# Patient Record
Sex: Female | Born: 1975 | Race: White | Hispanic: No | Marital: Single | Smoking: Former smoker
Health system: Southern US, Community
[De-identification: ages and names within clinical notes are randomized; demographics above are authoritative.]

## PROBLEM LIST (undated history)

## (undated) DIAGNOSIS — J45909 Unspecified asthma, uncomplicated: Secondary | ICD-10-CM

## (undated) DIAGNOSIS — D649 Anemia, unspecified: Secondary | ICD-10-CM

## (undated) DIAGNOSIS — I10 Essential (primary) hypertension: Secondary | ICD-10-CM

## (undated) HISTORY — PX: TUBAL LIGATION: SHX77

## (undated) HISTORY — PX: KNEE SURGERY: SHX244

## (undated) HISTORY — DX: Anemia, unspecified: D64.9

## (undated) HISTORY — DX: Essential (primary) hypertension: I10

## (undated) HISTORY — DX: Unspecified asthma, uncomplicated: J45.909

---

## 1999-12-04 ENCOUNTER — Emergency Department (HOSPITAL_COMMUNITY): Admission: EM | Admit: 1999-12-04 | Discharge: 1999-12-04 | Payer: Self-pay | Admitting: Emergency Medicine

## 2000-06-26 ENCOUNTER — Emergency Department (HOSPITAL_COMMUNITY): Admission: EM | Admit: 2000-06-26 | Discharge: 2000-06-26 | Payer: Self-pay | Admitting: Emergency Medicine

## 2002-02-01 ENCOUNTER — Encounter: Payer: Self-pay | Admitting: Emergency Medicine

## 2002-02-01 ENCOUNTER — Emergency Department (HOSPITAL_COMMUNITY): Admission: EM | Admit: 2002-02-01 | Discharge: 2002-02-01 | Payer: Self-pay | Admitting: Emergency Medicine

## 2006-07-25 ENCOUNTER — Emergency Department (HOSPITAL_COMMUNITY): Admission: EM | Admit: 2006-07-25 | Discharge: 2006-07-25 | Payer: Self-pay | Admitting: Emergency Medicine

## 2006-11-16 ENCOUNTER — Emergency Department (HOSPITAL_COMMUNITY): Admission: EM | Admit: 2006-11-16 | Discharge: 2006-11-17 | Payer: Self-pay | Admitting: Emergency Medicine

## 2008-08-26 ENCOUNTER — Emergency Department (HOSPITAL_COMMUNITY): Admission: EM | Admit: 2008-08-26 | Discharge: 2008-08-26 | Payer: Self-pay | Admitting: Emergency Medicine

## 2008-10-13 ENCOUNTER — Emergency Department (HOSPITAL_COMMUNITY): Admission: EM | Admit: 2008-10-13 | Discharge: 2008-10-13 | Payer: Self-pay | Admitting: Emergency Medicine

## 2009-02-05 ENCOUNTER — Emergency Department (HOSPITAL_COMMUNITY): Admission: EM | Admit: 2009-02-05 | Discharge: 2009-02-06 | Payer: Self-pay | Admitting: Emergency Medicine

## 2010-07-17 LAB — CBC
HCT: 39 % (ref 36.0–46.0)
Hemoglobin: 13.5 g/dL (ref 12.0–15.0)
MCHC: 34.5 g/dL (ref 30.0–36.0)
MCV: 89.5 fL (ref 78.0–100.0)
Platelets: 284 10*3/uL (ref 150–400)
RBC: 4.36 MIL/uL (ref 3.87–5.11)
RDW: 12.9 % (ref 11.5–15.5)
WBC: 9.9 10*3/uL (ref 4.0–10.5)

## 2010-07-17 LAB — WET PREP, GENITAL
Clue Cells Wet Prep HPF POC: NONE SEEN
Trich, Wet Prep: NONE SEEN

## 2010-07-17 LAB — DIFFERENTIAL
Basophils Absolute: 0 10*3/uL (ref 0.0–0.1)
Basophils Relative: 1 % (ref 0–1)
Eosinophils Absolute: 0.1 10*3/uL (ref 0.0–0.7)
Eosinophils Relative: 1 % (ref 0–5)
Lymphocytes Relative: 21 % (ref 12–46)
Lymphs Abs: 2.1 10*3/uL (ref 0.7–4.0)
Monocytes Absolute: 0.7 10*3/uL (ref 0.1–1.0)
Monocytes Relative: 7 % (ref 3–12)
Neutro Abs: 6.9 10*3/uL (ref 1.7–7.7)
Neutrophils Relative %: 70 % (ref 43–77)

## 2010-07-17 LAB — PROTIME-INR: Prothrombin Time: 13.1 seconds (ref 11.6–15.2)

## 2010-07-20 LAB — STREP A DNA PROBE

## 2010-07-20 LAB — RAPID STREP SCREEN (MED CTR MEBANE ONLY): Streptococcus, Group A Screen (Direct): NEGATIVE

## 2010-10-23 IMAGING — US US PELVIS COMPLETE MODIFY
1 series · 14 of 25 positions shown · non-contrast
Comparison: None available.

CLINICAL DATA: Vaginal bleeding. Pelvic pain.

TRANSABDOMINAL ULTRASOUND OF PELVIS
TECHNIQUE: Transabdominal ultrasound examination of the pelvis was
performed including evaluation of the uterus, ovaries, adnexal
regions, and pelvic cul-de-sac.

[Series 1: us pelvis complete modify · 0.28mm/px · 14 of 92 slices shown]
[im 1/92]
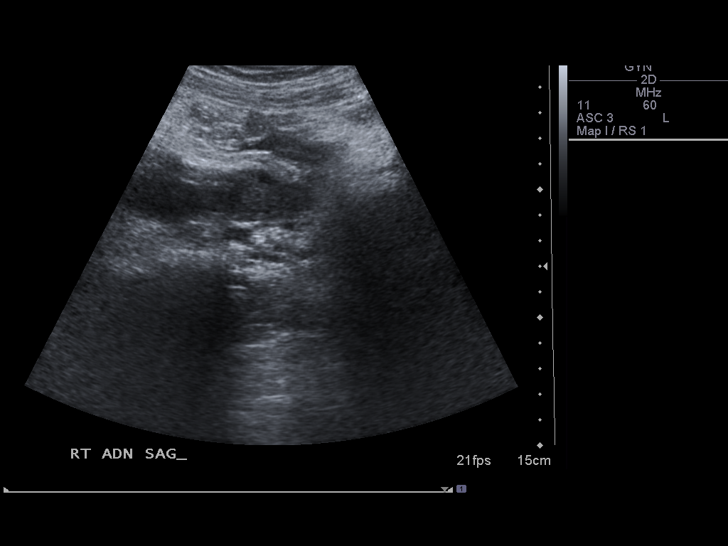
[im 8/92]
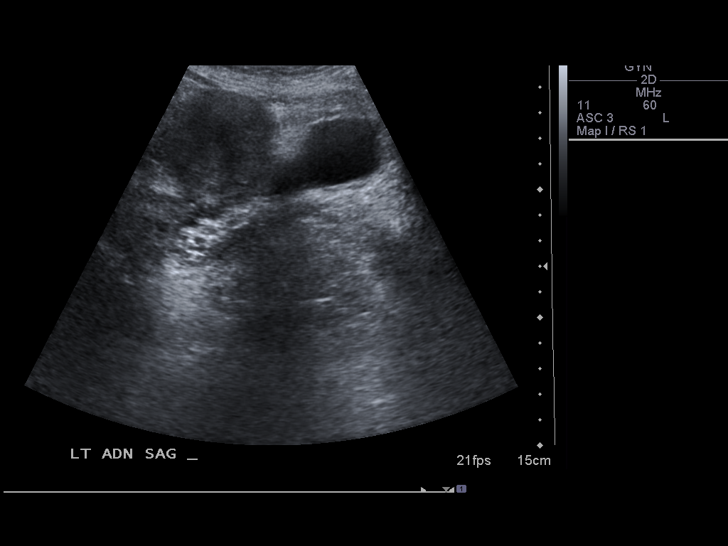
[im 16/92]
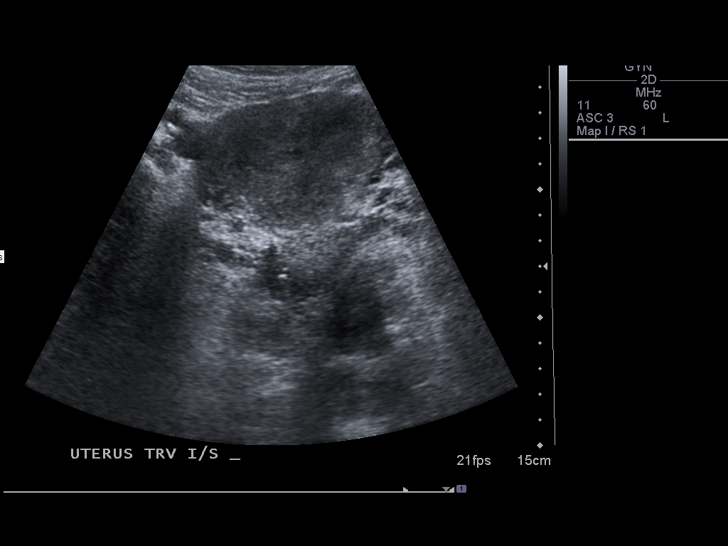
[im 23/92]
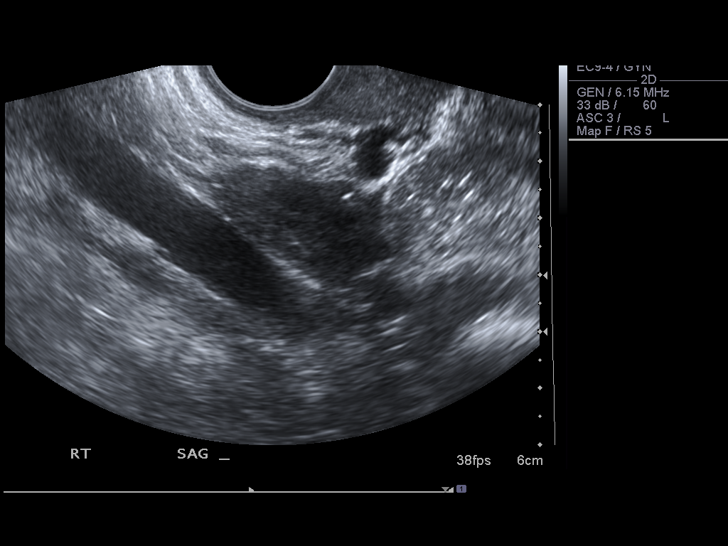
[im 31/92]
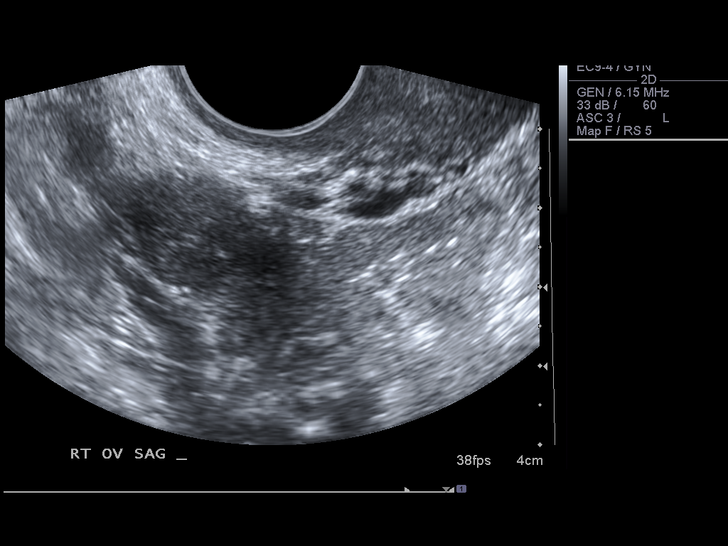
[im 35/92]
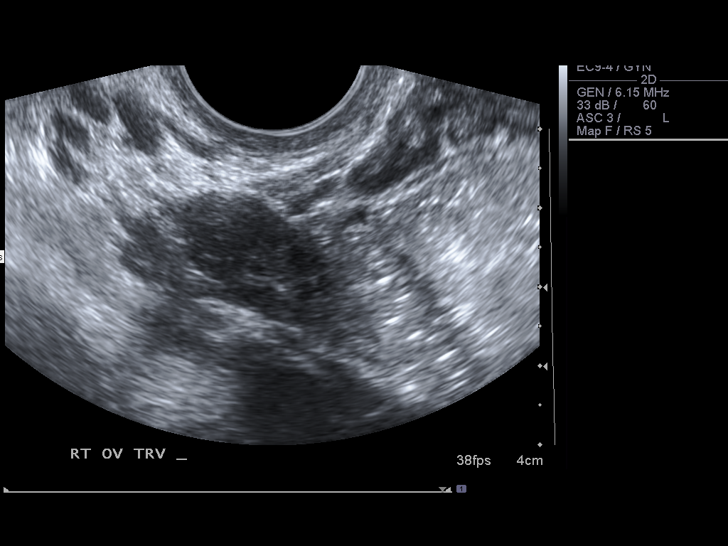
[im 42/92]
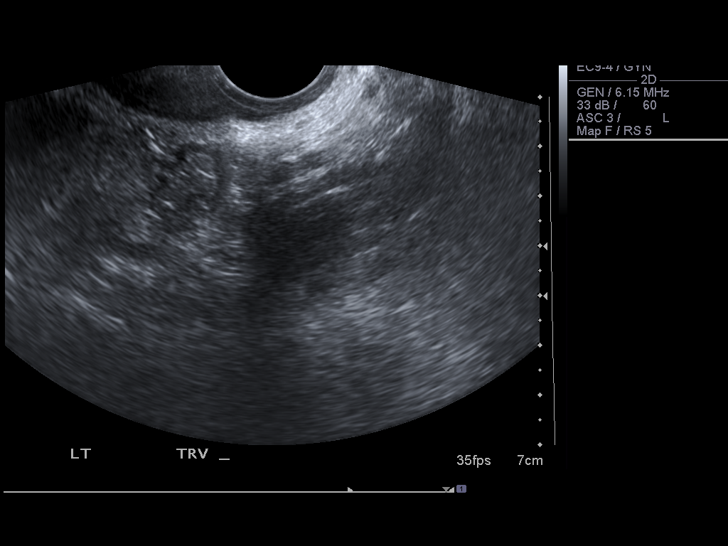
[im 50/92]
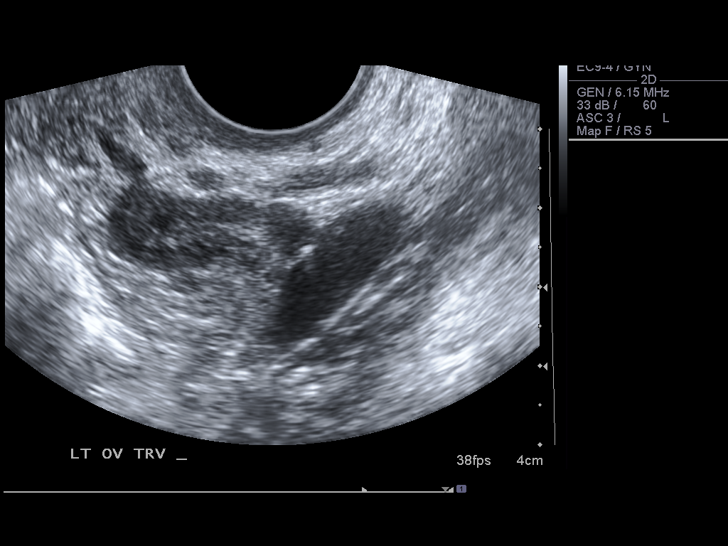
[im 57/92]
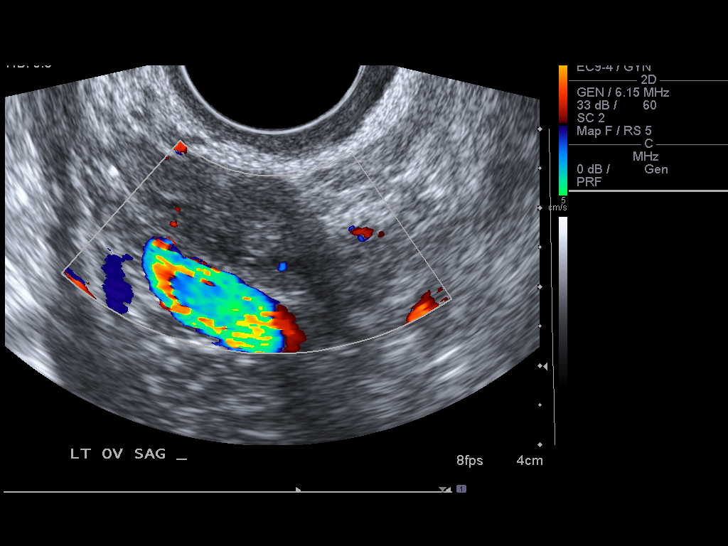
[im 61/92]
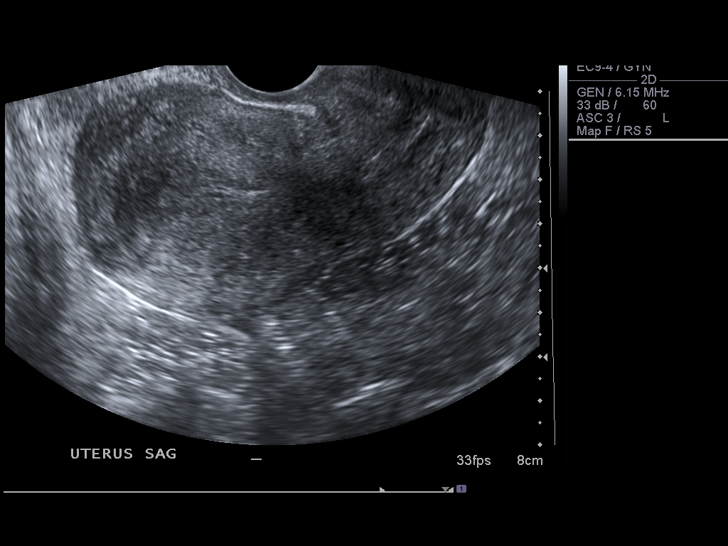
[im 69/92]
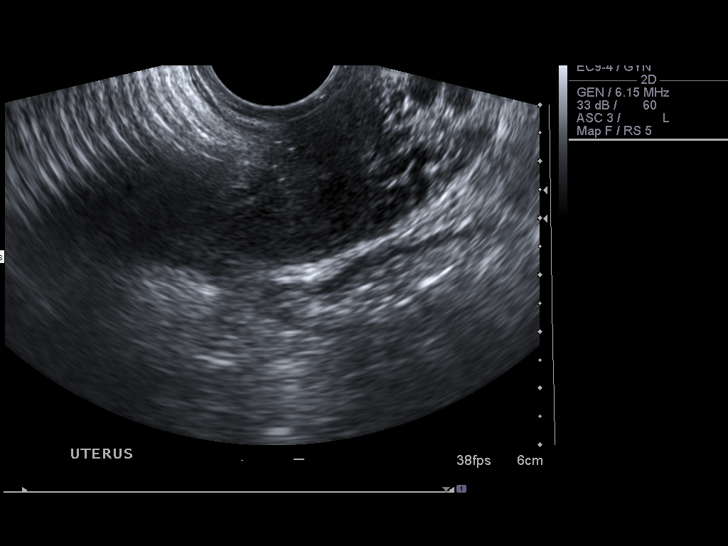
[im 76/92]
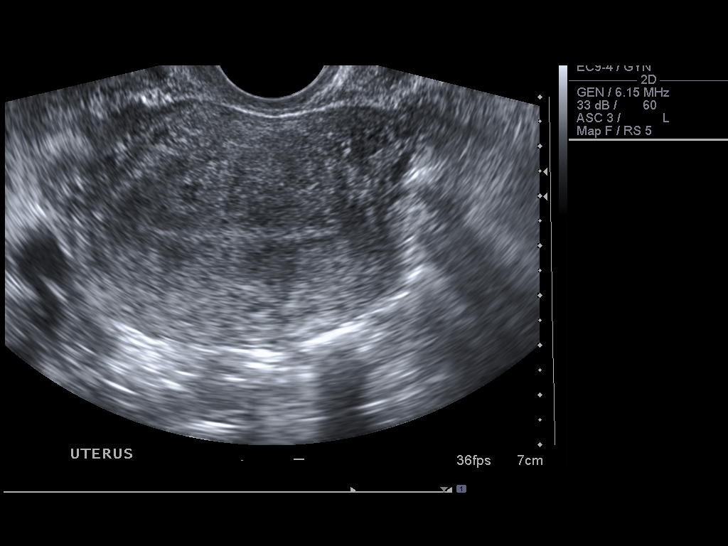
[im 84/92]
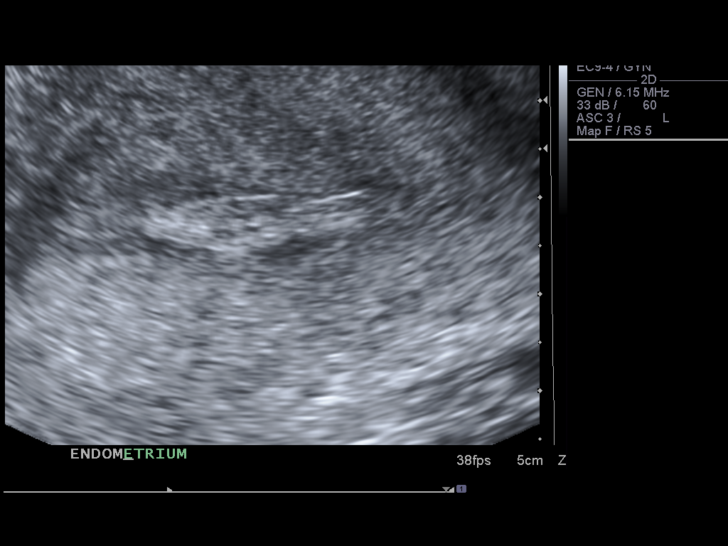
[im 92/92]
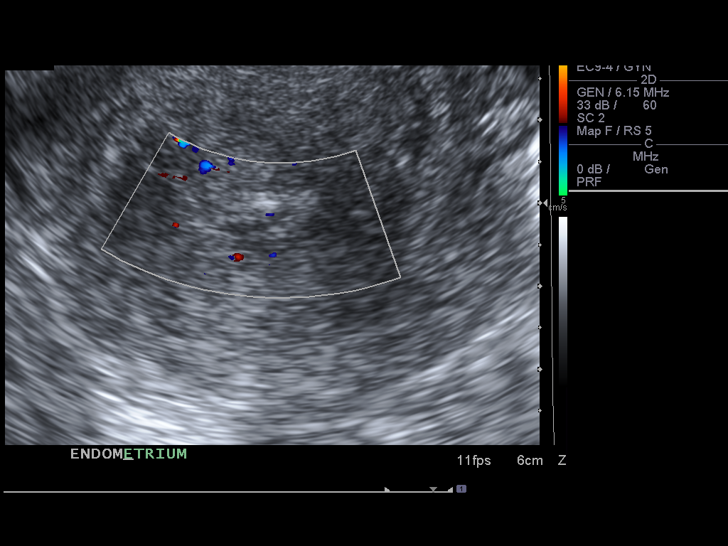

[14 of 25 positions shown; findings below may reference images not displayed]

FINDINGS: Uterus measures 8.9 x 5.1 x 7.1 cm.  No focal lesion is identified.
Echotexture is homogeneous.

Endometrium measures 0.4 cm appears normal.

Right Ovary measures 3.7 x 1.3 x 2.9 cm appears normal.

Left Ovary measures 3.1 x 1.3 x 2.2 cm appears normal.

Other Findings:  No free pelvic fluid.
IMPRESSION: Negative exam.

## 2015-07-19 ENCOUNTER — Encounter (HOSPITAL_COMMUNITY): Payer: Self-pay | Admitting: Emergency Medicine

## 2015-07-19 ENCOUNTER — Emergency Department (HOSPITAL_COMMUNITY)
Admission: EM | Admit: 2015-07-19 | Discharge: 2015-07-19 | Disposition: A | Discharge status: Home / Self Care | Payer: PRIVATE HEALTH INSURANCE | Attending: Emergency Medicine | Admitting: Emergency Medicine

## 2015-07-19 ENCOUNTER — Ambulatory Visit (INDEPENDENT_AMBULATORY_CARE_PROVIDER_SITE_OTHER): Payer: PRIVATE HEALTH INSURANCE | Admitting: Emergency Medicine

## 2015-07-19 VITALS — BP 180/122 | HR 111 | Temp 98.2°F | Resp 17 | Ht 63.0 in | Wt 144.6 lb

## 2015-07-19 DIAGNOSIS — K047 Periapical abscess without sinus: Secondary | ICD-10-CM | POA: Diagnosis not present

## 2015-07-19 DIAGNOSIS — Z720 Tobacco use: Secondary | ICD-10-CM | POA: Diagnosis not present

## 2015-07-19 DIAGNOSIS — I10 Essential (primary) hypertension: Secondary | ICD-10-CM | POA: Diagnosis not present

## 2015-07-19 DIAGNOSIS — Z87891 Personal history of nicotine dependence: Secondary | ICD-10-CM | POA: Insufficient documentation

## 2015-07-19 DIAGNOSIS — R42 Dizziness and giddiness: Secondary | ICD-10-CM

## 2015-07-19 DIAGNOSIS — K029 Dental caries, unspecified: Secondary | ICD-10-CM | POA: Diagnosis not present

## 2015-07-19 DIAGNOSIS — K0889 Other specified disorders of teeth and supporting structures: Secondary | ICD-10-CM | POA: Diagnosis present

## 2015-07-19 DIAGNOSIS — I160 Hypertensive urgency: Secondary | ICD-10-CM | POA: Diagnosis not present

## 2015-07-19 DIAGNOSIS — J45909 Unspecified asthma, uncomplicated: Secondary | ICD-10-CM | POA: Diagnosis not present

## 2015-07-19 DIAGNOSIS — D649 Anemia, unspecified: Secondary | ICD-10-CM | POA: Diagnosis not present

## 2015-07-19 DIAGNOSIS — Z79899 Other long term (current) drug therapy: Secondary | ICD-10-CM | POA: Insufficient documentation

## 2015-07-19 DIAGNOSIS — F17200 Nicotine dependence, unspecified, uncomplicated: Secondary | ICD-10-CM

## 2015-07-19 LAB — BASIC METABOLIC PANEL
Anion gap: 8 (ref 5–15)
BUN: 6 mg/dL (ref 6–20)
CALCIUM: 8.8 mg/dL — AB (ref 8.9–10.3)
CHLORIDE: 108 mmol/L (ref 101–111)
CO2: 23 mmol/L (ref 22–32)
CREATININE: 0.68 mg/dL (ref 0.44–1.00)
GFR calc Af Amer: >60 mL/min (ref >60)
GLUCOSE: 91 mg/dL (ref 65–99)
Potassium: 3.6 mmol/L (ref 3.5–5.1)
SODIUM: 139 mmol/L (ref 135–145)

## 2015-07-19 LAB — CBC WITH DIFFERENTIAL/PLATELET
BASOS ABS: 0 10*3/uL (ref 0.0–0.1)
BASOS PCT: 0 %
Eosinophils Absolute: 0.1 10*3/uL (ref 0.0–0.7)
Eosinophils Relative: 1 %
HEMATOCRIT: 41.7 % (ref 36.0–46.0)
HEMOGLOBIN: 14.2 g/dL (ref 12.0–15.0)
LYMPHS PCT: 11 %
Lymphs Abs: 1.5 10*3/uL (ref 0.7–4.0)
MCH: 30.4 pg (ref 26.0–34.0)
MCHC: 34.1 g/dL (ref 30.0–36.0)
MCV: 89.3 fL (ref 78.0–100.0)
Monocytes Absolute: 1.3 10*3/uL — ABNORMAL HIGH (ref 0.1–1.0)
Monocytes Relative: 9 %
NEUTROS ABS: 10.9 10*3/uL — AB (ref 1.7–7.7)
NEUTROS PCT: 79 %
Platelets: 273 10*3/uL (ref 150–400)
RBC: 4.67 MIL/uL (ref 3.87–5.11)
RDW: 12.6 % (ref 11.5–15.5)
WBC: 13.9 10*3/uL — AB (ref 4.0–10.5)

## 2015-07-19 LAB — BASIC METABOLIC PANEL WITH GFR
BUN: 5 mg/dL — AB (ref 7–25)
CALCIUM: 9.1 mg/dL (ref 8.6–10.2)
CO2: 25 mmol/L (ref 20–31)
CREATININE: 0.73 mg/dL (ref 0.50–1.10)
Chloride: 104 mmol/L (ref 98–110)
GFR, Est African American: >89 mL/min (ref >=60)
GFR, Est Non African American: >89 mL/min (ref >=60)
Glucose, Bld: 78 mg/dL (ref 65–99)
Potassium: 4.1 mmol/L (ref 3.5–5.3)
SODIUM: 139 mmol/L (ref 135–146)

## 2015-07-19 LAB — POCT CBC
GRANULOCYTE PERCENT: 76.7 % (ref 37–80)
HEMATOCRIT: 42.9 % (ref 37.7–47.9)
HEMOGLOBIN: 15.2 g/dL (ref 12.2–16.2)
Lymph, poc: 1.9 (ref 0.6–3.4)
MCH, POC: 31.9 pg — AB (ref 27–31.2)
MCHC: 35.6 g/dL — AB (ref 31.8–35.4)
MCV: 89.7 fL (ref 80–97)
MID (cbc): 1.2 — AB (ref 0–0.9)
MPV: 8.2 fL (ref 0–99.8)
POC GRANULOCYTE: 10.3 — AB (ref 2–6.9)
POC LYMPH %: 14.2 % (ref 10–50)
POC MID %: 9.1 % (ref 0–12)
Platelet Count, POC: 275 10*3/uL (ref 142–424)
RBC: 4.78 M/uL (ref 4.04–5.48)
RDW, POC: 12.5 %
WBC: 13.4 10*3/uL — AB (ref 4.6–10.2)

## 2015-07-19 LAB — TSH: TSH: 1.11 mIU/L

## 2015-07-19 MED ORDER — ONDANSETRON 8 MG PO TBDP
8.0000 mg | ORAL_TABLET | Freq: Once | ORAL | Status: AC
  Administered 2015-07-19: 8 mg via ORAL
  Filled 2015-07-19: qty 1

## 2015-07-19 MED ORDER — CLINDAMYCIN PHOSPHATE 600 MG/50ML IV SOLN
600.0000 mg | Freq: Once | INTRAVENOUS | Status: AC
  Administered 2015-07-19: 600 mg via INTRAVENOUS
  Filled 2015-07-19: qty 50

## 2015-07-19 MED ORDER — LABETALOL HCL 200 MG PO TABS
200.0000 mg | ORAL_TABLET | Freq: Once | ORAL | Status: AC
  Administered 2015-07-19: 200 mg via ORAL
  Filled 2015-07-19: qty 1

## 2015-07-19 MED ORDER — MORPHINE SULFATE (PF) 4 MG/ML IV SOLN
4.0000 mg | Freq: Once | INTRAVENOUS | Status: AC
  Administered 2015-07-19: 4 mg via INTRAVENOUS
  Filled 2015-07-19: qty 1

## 2015-07-19 MED ORDER — CLINDAMYCIN HCL 300 MG PO CAPS: 300.0000 mg | ORAL_CAPSULE | Freq: Four times a day (QID) | ORAL | Status: DC

## 2015-07-19 MED ORDER — LISINOPRIL 10 MG PO TABS: 10.0000 mg | ORAL_TABLET | Freq: Every day | ORAL | Status: AC

## 2015-07-19 MED ORDER — OXYCODONE-ACETAMINOPHEN 5-325 MG PO TABS: 1.0000 | ORAL_TABLET | Freq: Four times a day (QID) | ORAL | Status: AC | PRN

## 2015-07-19 NOTE — Discharge Instructions (Signed)
Clindamycin as prescribed.  Percocet as prescribed as needed for pain.  Lisinopril as prescribed.  Follow-up with dentistry in the next 3-4 days.   Dental Abscess A dental abscess is a collection of pus in or around a tooth. CAUSES This condition is caused by a bacterial infection around the root of the tooth that involves the inner part of the tooth (pulp). It may result from:  Severe tooth decay.  Trauma to the tooth that allows bacteria to enter into the pulp, such as a broken or chipped tooth.  Severe gum disease around a tooth. SYMPTOMS Symptoms of this condition include:  Severe pain in and around the infected tooth.  Swelling and redness around the infected tooth, in the mouth, or in the face.  Tenderness.  Pus drainage.  Bad breath.  Bitter taste in the mouth.  Difficulty swallowing.  Difficulty opening the mouth.  Nausea.  Vomiting.  Chills.  Swollen neck glands.  Fever. DIAGNOSIS This condition is diagnosed with examination of the infected tooth. During the exam, your dentist may tap on the infected tooth. Your dentist will also ask about your medical and dental history and may order X-rays. TREATMENT This condition is treated by eliminating the infection. This may be done with:  Antibiotic medicine.  A root canal. This may be performed to save the tooth.  Pulling (extracting) the tooth. This may also involve draining the abscess. This is done if the tooth cannot be saved. HOME CARE INSTRUCTIONS  Take medicines only as directed by your dentist.  If you were prescribed antibiotic medicine, finish all of it even if you start to feel better.  Rinse your mouth (gargle) often with salt water to relieve pain or swelling.  Do not drive or operate heavy machinery while taking pain medicine.  Do not apply heat to the outside of your mouth.  Keep all follow-up visits as directed by your dentist. This is important. SEEK MEDICAL CARE IF:  Your  pain is worse and is not helped by medicine. SEEK IMMEDIATE MEDICAL CARE IF:  You have a fever or chills.  Your symptoms suddenly get worse.  You have a very bad headache.  You have problems breathing or swallowing.  You have trouble opening your mouth.  You have swelling in your neck or around your eye.   This information is not intended to replace advice given to you by your health care provider. Make sure you discuss any questions you have with your health care provider.   Document Released: 03/30/2005 Document Revised: 08/14/2014 Document Reviewed: 03/27/2014 Elsevier Interactive Patient Education [employer]2016 Elsevier Inc.

## 2015-07-19 NOTE — Progress Notes (Addendum)
By signing my name below, I, Mesha Guiyard, attest that this documentation has been prepared under the direction and in the presence of Earl LitesSteve Daub, MD.  Electronically Signed: Arvilla MarketMesha Guinyard, Medical Scribe. 07/19/2015. 12:10 PM.   Chief Complaint:  Chief Complaint  Patient presents with  . Hypertension  . Dizziness    room is spinning, increase heart rate , some headache     HPI: Holly Beck is a 40 y.o. female who [facility] to [facility] today complaining of dental pain on the right side of mouth, and swelling started yesterday. She went to the dentist years ago, and she has a chipped tooth on the right side. She's afraid she has infected tooth. She said she never had a cap on her tooth, and doesn't eat a lot of salt. She has a history of hypertension and was on 10 mg of lisinopril until 4-5 years ago. She experiences chest pain occasionally, and no longer smokes. She started vaping with 24ml, and has decreased to 6 ml. If she goes down on the dosage too fast for vaping, her vision comes and goes with some trouble breathing.   Last night, and 4 days ago before bed, she experienced dizziness and said the room started spinning. 3 days ago at work a little after lunch, she felt dizzy, and nauseous. She checked her bp 3 days ago at [facility] with a three reading average of 194/125. She found out 2-3 weeks ago that she has high bp, and thinks she's had it for her whole life. She can walk fine, and she used to go to gym 3-4 times a week, but couldn't go last week, or this week.    Bp readings: 180/116, 186/118 She had her tubes tied and her knee operated on. FHx: Her mom has high BP, and dad has heart disease.    Past Medical History  Diagnosis Date  . Anemia   . Asthma   . Hypertension    Past Surgical History  Procedure Laterality Date  . Tubal ligation    . Knee surgery     Social History   Social History  . Marital Status: Single    Spouse Name: N/A  . Number of Children: N/A    . Years of Education: N/A   Social History Main Topics  . Smoking status: Former Games developermoker  . Smokeless tobacco: Current User     Comment: Vapor   . Alcohol Use: No  . Drug Use: No  . Sexual Activity: Not Asked   Other Topics Concern  . None   Social History Narrative  . None   Family History  Problem Relation Age of Onset  . Cancer Mother    No Known Allergies Prior to Admission medications   Not on File     ROS: The patient denies fevers, chills, night sweats, unintentional weight loss, palpitations, wheezing, dyspnea on exertion, vomiting, dysuria, abdominal pain, hematuria, melena, numbness, weakness, or tingling.  All other systems have been reviewed and were otherwise negative with the exception of those mentioned in the HPI and as above.    PHYSICAL EXAM: Filed Vitals:   07/19/15 1129 07/19/15 1140  BP: 205/138 180/122  Pulse: 111   Temp: 98.2 F (36.8 C)   Resp: 17    Body mass index is 25.62 kg/(m^2).   General: Alert, no acute distress HEENT:  Normocephalic, atraumatic, oropharynx patent. Right first molar broken off at gum line with purulent drainage. Puffiness on face and no adenopathy.  Eye: Nonie Hoyer [facility] Cardiovascular:  Regular rate and rhythm, no rubs murmurs .  No Carotid bruits, radial pulse intact. No pedal edema. Chest was clear. S4 gallop Respiratory: Clear to auscultation bilaterally.  No wheezes, rales, or rhonchi.  No cyanosis, no use of accessory musculature Abdominal: No organomegaly, abdomen is soft and non-tender, positive bowel sounds.  No masses. Musculoskeletal: Gait intact. No edema, tenderness Skin: No rashes. Neurologic: Facial musculature symmetric. Psychiatric: Patient acts appropriately throughout our interaction. Lymphatic: No cervical or submandibular lymphadenopathy     LABS: Results for orders placed or performed in visit on 07/19/15  POCT CBC  Result Value Ref Range   WBC 13.4 (A) 4.6 - 10.2 K/uL   Lymph, poc 1.9  0.6 - 3.4   POC LYMPH PERCENT 14.2 10 - 50 %L   MID (cbc) 1.2 (A) 0 - 0.9   POC MID % 9.1 0 - 12 %M   POC Granulocyte 10.3 (A) 2 - 6.9   Granulocyte percent 76.7 37 - 80 %G   RBC 4.78 4.04 - 5.48 M/uL   Hemoglobin 15.2 12.2 - 16.2 g/dL   HCT, POC 16.1 09.6 - 47.9 %   MCV 89.7 80 - 97 fL   MCH, POC 31.9 (A) 27 - 31.2 pg   MCHC 35.6 (A) 31.8 - 35.4 g/dL   RDW, POC 04.5 %   Platelet Count, POC 275 142 - 424 K/uL   MPV 8.2 0 - 99.8 fL     EKG/XRAY:   Primary read interpreted by Dr. Cleta Alberts at [facility]. No acute changes   ASSESSMENT/PLAN: I repeated blood pressures again both arms she runs between 210-200//120-124. [facility] emergency room called they will see patient with hypertensive urgency. Patient is neurologically intact to go by private car for evaluation. she also has dental abscess. I personally performed the services described in this documentation, which was scribed in my presence. The recorded information has been reviewed and is accurate. Patient to be evaluated for IV antihypertensives as well as consideration for CT.   Gross sideeffects, risk and benefits, and alternatives of medications d/w patient. Patient is aware that all medications have potential sideeffects and we are unable to predict every sideeffect or drug-drug interaction that may occur.  Lesle Chris MD 07/19/2015 11:55 AM

## 2015-07-19 NOTE — Patient Instructions (Addendum)
Please go from here to [facility] and register to be seen in the ER.    IF you received an x-ray today, you will receive an invoice from [facility]. Please contact [facility] at [phone] with questions or concerns regarding your invoice.   IF you received labwork today, you will receive an invoice from [facility]. Please contact [facility] at [phone] with questions or concerns regarding your invoice.   Our billing staff will not be able to assist you with questions regarding bills from these companies.  You will be contacted with the lab results as soon as they are available. The fastest way to get your results is to activate your My Chart account. Instructions are located on the last page of this paperwork. If you have not heard from us regarding the results in 2 weeks, please contact this office.

## 2015-07-19 NOTE — ED Provider Notes (Signed)
CSN: [id number]     Arrival date & time 07/19/15  1309 History   First MD Initiated Contact with Patient 07/19/15 1457     Chief Complaint  Patient presents with  . Hypertension  . Abscess     (Consider location/radiation/quality/duration/timing/severity/associated sxs/prior Treatment) HPI Comments: Patient is a 40 year old female with history of hypertension. She presents for evaluation of dental pain. She has having severe pain in her right upper bicuspid and first molar along with facial swelling and discomfort. She was seen at the doctor's office and found to had a markedly elevated blood pressure. The physician there was concerned about this and sensory here for evaluation. She denies fevers or chills. She does difficulty swallowing or breathing.  She has been on lisinopril in the past for her hypertension, however is been off of this for some time due to loss of primary care. She recently relocated to the area and has not found a doctor until recently.  Patient is a 40 y.o. female presenting with tooth pain. The history is provided by the patient.  Dental Pain Location:  Upper Upper teeth location:  3/RU 1st molar and 4/RU 2nd bicuspid Quality:  Throbbing Severity:  Severe Onset quality:  Gradual Duration:  3 days Timing:  Constant Progression:  Worsening Chronicity:  New Context: abscess   Relieved by:  Nothing Worsened by:  Nothing tried Ineffective treatments:  None tried   Past Medical History  Diagnosis Date  . Anemia   . Asthma   . Hypertension    Past Surgical History  Procedure Laterality Date  . Tubal ligation    . Knee surgery     Family History  Problem Relation Age of Onset  . Cancer Mother    Social History  Substance Use Topics  . Smoking status: Former Games developermoker  . Smokeless tobacco: Current User     Comment: Vapor   . Alcohol Use: No   OB History    No data available     Review of Systems  All other systems reviewed and are  negative.     Allergies  Garlic and Onion  Home Medications   Prior to Admission medications   Medication Sig Start Date End Date Taking? Authorizing Provider  acetaminophen (TYLENOL) 500 MG tablet Take 1,000 mg by mouth every 6 (six) hours as needed for moderate pain or headache.   Yes Historical Provider, MD  Ferrous Sulfate Dried (SLOW RELEASE IRON) 45 MG TBCR Take 45 mg by mouth daily.   Yes Historical Provider, MD  TURMERIC PO Take 2 capsules by mouth daily.   Yes Historical Provider, MD   BP 235/138 mmHg  Pulse 87  Temp(Src) 98.3 F (36.8 C) (Oral)  Resp 16  SpO2 100% Physical Exam  Constitutional: She is oriented to person, place, and time. She appears well-developed and well-nourished. No distress.  HENT:  Head: Normocephalic and atraumatic.  Mouth/Throat: Oropharynx is clear and moist.  The right upper bicuspid and first molar are heavily decayed. There is surrounding gingival inflammation but no obvious abscess. There is some swelling to the right cheek.  Neck: Normal range of motion. Neck supple.  Cardiovascular: Normal rate and regular rhythm.  Exam reveals no gallop and no friction rub.   No murmur heard. Pulmonary/Chest: Effort normal and breath sounds normal. No respiratory distress. She has no wheezes.  Abdominal: Soft. Bowel sounds are normal. She exhibits no distension. There is no tenderness.  Musculoskeletal: Normal range of motion.  Neurological: She is alert  and oriented to person, place, and time.  Skin: Skin is warm and dry. She is not diaphoretic.  Nursing note and vitals reviewed.   ED Course  Procedures (including critical care time) Labs Review Labs Reviewed  BASIC METABOLIC PANEL  CBC WITH DIFFERENTIAL/PLATELET    Imaging Review No results found. I have personally reviewed and evaluated these images and lab results as part of my medical decision-making.   EKG Interpretation None      MDM   Final diagnoses:  None    Blood  pressure improved with medications given in the ER. She was given IV clindamycin for what appears to be a dental abscess. She will be started on oral clindamycin, pain medication, and started back up on her lisinopril.    Geoffery Lyons, MD 07/19/15 [phone]

## 2015-07-19 NOTE — ED Notes (Signed)
Pt's PCP called to say pt would be coming in for hypertension. Upon arrival, Pt c/o R upper dental abscess x 2 days. Pt sts she went to her PCP for the abscess and he found her pressures to be elevated. Pt sts PCP did nothing for the abscess and that her BP is so high because of how much pain she is in. Pt has hx of hypertension and used to be on medication but has since moved and switched PCPs and is no longer on medication. Pt denies dizziness, N/V, Headaches. Pt Denies blurry vision. Pt sts "I'm hungry and hurting."

## 2015-07-23 ENCOUNTER — Encounter (HOSPITAL_COMMUNITY): Payer: Self-pay | Admitting: Emergency Medicine

## 2015-07-23 ENCOUNTER — Emergency Department (HOSPITAL_COMMUNITY)
Admission: EM | Admit: 2015-07-23 | Discharge: 2015-07-23 | Disposition: A | Discharge status: Home / Self Care | Payer: PRIVATE HEALTH INSURANCE | Attending: Emergency Medicine | Admitting: Emergency Medicine

## 2015-07-23 ENCOUNTER — Emergency Department (HOSPITAL_COMMUNITY): Payer: PRIVATE HEALTH INSURANCE

## 2015-07-23 DIAGNOSIS — Z79899 Other long term (current) drug therapy: Secondary | ICD-10-CM | POA: Insufficient documentation

## 2015-07-23 DIAGNOSIS — I10 Essential (primary) hypertension: Secondary | ICD-10-CM | POA: Diagnosis not present

## 2015-07-23 DIAGNOSIS — Z87891 Personal history of nicotine dependence: Secondary | ICD-10-CM | POA: Diagnosis not present

## 2015-07-23 DIAGNOSIS — J45909 Unspecified asthma, uncomplicated: Secondary | ICD-10-CM | POA: Diagnosis not present

## 2015-07-23 DIAGNOSIS — I159 Secondary hypertension, unspecified: Secondary | ICD-10-CM | POA: Insufficient documentation

## 2015-07-23 DIAGNOSIS — R0789 Other chest pain: Secondary | ICD-10-CM | POA: Diagnosis not present

## 2015-07-23 DIAGNOSIS — R0602 Shortness of breath: Secondary | ICD-10-CM | POA: Diagnosis present

## 2015-07-23 DIAGNOSIS — D649 Anemia, unspecified: Secondary | ICD-10-CM | POA: Insufficient documentation

## 2015-07-23 LAB — CBC WITH DIFFERENTIAL/PLATELET
BASOS ABS: 0 10*3/uL (ref 0.0–0.1)
Basophils Relative: 0 %
EOS ABS: 0.1 10*3/uL (ref 0.0–0.7)
EOS PCT: 2 %
HCT: 38.9 % (ref 36.0–46.0)
Hemoglobin: 13.3 g/dL (ref 12.0–15.0)
LYMPHS ABS: 1.2 10*3/uL (ref 0.7–4.0)
Lymphocytes Relative: 18 %
MCH: 30.4 pg (ref 26.0–34.0)
MCHC: 34.2 g/dL (ref 30.0–36.0)
MCV: 89 fL (ref 78.0–100.0)
MONO ABS: 0.6 10*3/uL (ref 0.1–1.0)
Monocytes Relative: 10 %
Neutro Abs: 4.6 10*3/uL (ref 1.7–7.7)
Neutrophils Relative %: 70 %
PLATELETS: 301 10*3/uL (ref 150–400)
RBC: 4.37 MIL/uL (ref 3.87–5.11)
RDW: 12.4 % (ref 11.5–15.5)
WBC: 6.6 10*3/uL (ref 4.0–10.5)

## 2015-07-23 LAB — TROPONIN I

## 2015-07-23 LAB — COMPREHENSIVE METABOLIC PANEL
ALT: 15 U/L (ref 14–54)
ANION GAP: 7 (ref 5–15)
AST: 17 U/L (ref 15–41)
Albumin: 4.1 g/dL (ref 3.5–5.0)
Alkaline Phosphatase: 43 U/L (ref 38–126)
BUN: 9 mg/dL (ref 6–20)
CHLORIDE: 109 mmol/L (ref 101–111)
CO2: 25 mmol/L (ref 22–32)
CREATININE: 0.63 mg/dL (ref 0.44–1.00)
Calcium: 9.3 mg/dL (ref 8.9–10.3)
Glucose, Bld: 100 mg/dL — ABNORMAL HIGH (ref 65–99)
POTASSIUM: 4.2 mmol/L (ref 3.5–5.1)
SODIUM: 141 mmol/L (ref 135–145)
Total Bilirubin: 0.3 mg/dL (ref 0.3–1.2)
Total Protein: 7.2 g/dL (ref 6.5–8.1)

## 2015-07-23 LAB — CBG MONITORING, ED: GLUCOSE-CAPILLARY: 94 mg/dL (ref 65–99)

## 2015-07-23 MED ORDER — CLONIDINE HCL 0.1 MG PO TABS
0.2000 mg | ORAL_TABLET | Freq: Once | ORAL | Status: AC
  Administered 2015-07-23: 0.2 mg via ORAL
  Filled 2015-07-23: qty 2

## 2015-07-23 NOTE — ED Notes (Signed)
Per patient, states BP high-states she was prescribed Lisinopril on Friday-states still high

## 2015-07-23 NOTE — ED Provider Notes (Signed)
CSN: [id number]     Arrival date & time 07/23/15  1002 History   None    Chief Complaint  Patient presents with  . Hypertension     (Consider location/radiation/quality/duration/timing/severity/associated sxs/prior Treatment) The history is provided by the patient and medical records. No language interpreter was used.   Holly Beck is a 40 y.o. female  with a PMH of HTN and asthma who presents to the Emergency Department complaining of persistently elevated blood pressure over the last 3 days (168/126 in triage). Patient states while she was waiting in the triage room she felt pressure like someone sitting on her chest for approximately 3-5 minutes. Associated symptoms include bilateral arms squeezing "like a tourniquet is on both arms" and shortness of breath. Patient denies nausea, vomiting, diaphoresis.  She was seen on 4/07 for a dental abscess where her blood pressure was elevated at 205/138. She was given labetalol 200mg  PO while in ED with improvement of BP and given rx for lisinopril 10mg . Patient states she has been taking lisinopril daily - yesterday she took two 10 mg tablets because her home blood pressure was in the 190s systolic with home BP cuff. She was also treated for dental infection with clindamycin which she states she has been compliant with and symptoms are improved.   Patient states she was on Lisinopril 4-5 years ago, however loss of insurance led to a loss of primary care provider. She recently got a new insurance plan and is looking for a primary care provider in the area.    Past Medical History  Diagnosis Date  . Anemia   . Asthma   . Hypertension    Past Surgical History  Procedure Laterality Date  . Tubal ligation    . Knee surgery     Family History  Problem Relation Age of Onset  . Cancer Mother    Social History  Substance Use Topics  . Smoking status: Former Games developermoker  . Smokeless tobacco: Current User     Comment: Vapor   . Alcohol Use: No     OB History    No data available     Review of Systems  Constitutional: Negative for fever, chills and diaphoresis.  HENT: Negative for congestion.   Eyes: Negative for visual disturbance.  Respiratory: Positive for shortness of breath. Negative for cough.   Cardiovascular: Positive for chest pain (Pressure). Negative for leg swelling.  Gastrointestinal: Negative for nausea, vomiting and abdominal pain.  Musculoskeletal: Negative for neck pain.  Skin: Negative for rash.  Allergic/Immunologic: Negative for immunocompromised state.  Neurological: Negative for headaches.      Allergies  Garlic and Onion  Home Medications   Prior to Admission medications   Medication Sig Start Date End Date Taking? Authorizing Provider  acetaminophen (TYLENOL) 500 MG tablet Take 1,000 mg by mouth every 6 (six) hours as needed for moderate pain or headache.    Historical Provider, MD  clindamycin (CLEOCIN) 300 MG capsule Take 1 capsule (300 mg total) by mouth 4 (four) times daily. X 7 days 07/19/15   Geoffery Lyonsouglas Delo, MD  Ferrous Sulfate Dried (SLOW RELEASE IRON) 45 MG TBCR Take 45 mg by mouth daily.    Historical Provider, MD  lisinopril (ZESTRIL) 10 MG tablet Take 1 tablet (10 mg total) by mouth daily. 07/19/15   Geoffery Lyonsouglas Delo, MD  oxyCODONE-acetaminophen (PERCOCET) 5-325 MG tablet Take 1-2 tablets by mouth every 6 (six) hours as needed. 07/19/15   Geoffery Lyonsouglas Delo, MD  TURMERIC PO Take 2  capsules by mouth daily.    Historical Provider, MD   BP 168/126 mmHg  Pulse 82  Temp(Src) 98.3 F (36.8 C) (Oral)  Resp 18  SpO2 100%  LMP 07/03/2015 Physical Exam  Constitutional: She is oriented to person, place, and time. She appears well-developed and well-nourished.  Alert and in no acute distress  HENT:  Head: Normocephalic and atraumatic.  Mouth/Throat:    Poor oral dentition noted, pain along tooth as depicted in image ,midline uvula, no trismus, oropharynx moist and clear, no abscess noted, no  oropharyngeal erythema or edema, neck supple and no tenderness. No facial edema  Cardiovascular: Normal rate, regular rhythm, normal heart sounds and intact distal pulses.  Exam reveals no gallop and no friction rub.   No murmur heard. Pulmonary/Chest: Effort normal and breath sounds normal. No respiratory distress. She has no wheezes. She has no rales. She exhibits no tenderness.  Abdominal: Soft. Bowel sounds are normal. She exhibits no distension and no mass. There is no tenderness. There is no rebound and no guarding.  Musculoskeletal: She exhibits no edema.  Neurological: She is alert and oriented to person, place, and time.  Skin: Skin is warm and dry. She is not diaphoretic.  Nursing note and vitals reviewed.   ED Course  Procedures (including critical care time) Labs Review Labs Reviewed  CBC WITH DIFFERENTIAL/PLATELET  COMPREHENSIVE METABOLIC PANEL  TROPONIN I  CBG MONITORING, ED    Imaging Review No results found. I have personally reviewed and evaluated these images and lab results as part of my medical decision-making.   EKG Interpretation   Date/Time:  Tuesday July 23 2015 10:14:27 EDT Ventricular Rate:  82 PR Interval:  153 QRS Duration: 79 QT Interval:  345 QTC Calculation: 403 R Axis:   3 Text Interpretation:  Sinus rhythm No previous ECGs available Confirmed by  [facility] MD, ERIN ([id number]) on 07/23/2015 10:30:14 AM      MDM   Final diagnoses:  Chest pressure   Holly Beck is a 40 y.o. female who presented to the fast track ED for elevated blood pressure. BP of 168/126 today in triage. During initial evaluation, patient informed me of chest pressure, shortness of breath, and bilateral arms squeezing. Given elevated blood pressure and chest pain sxs, EKG, chest x-ray, and lab work were ordered. Patient was discussed with Dr. Patria Mane who agrees that patient should be evaluated in the acute ED, and will be moved to continue evaluation and management at a  higher level of care.  [facility] Ward, PA-C 07/23/15 1059  Azalia Bilis, MD 07/23/15 1249

## 2015-07-23 NOTE — Discharge Instructions (Signed)
Hypertension Hypertension, commonly called high blood pressure, is when the force of blood pumping through your arteries is too strong. Your arteries are the blood vessels that carry blood from your heart throughout your body. A blood pressure reading consists of a higher number over a lower number, such as 110/72. The higher number (systolic) is the pressure inside your arteries when your heart pumps. The lower number (diastolic) is the pressure inside your arteries when your heart relaxes. Ideally you want your blood pressure below 120/80. Hypertension forces your heart to work harder to pump blood. Your arteries may become narrow or stiff. Having untreated or uncontrolled hypertension can cause heart attack, stroke, kidney disease, and other problems. RISK FACTORS Some risk factors for high blood pressure are controllable. Others are not.  Risk factors you cannot control include:   Race. You may be at higher risk if you are African American.  Age. Risk increases with age.  Gender. Men are at higher risk than women before age 45 years. After age 65, women are at higher risk than men. Risk factors you can control include:  Not getting enough exercise or physical activity.  Being overweight.  Getting too much fat, sugar, calories, or salt in your diet.  Drinking too much alcohol. SIGNS AND SYMPTOMS Hypertension does not usually cause signs or symptoms. Extremely high blood pressure (hypertensive crisis) may cause headache, anxiety, shortness of breath, and nosebleed. DIAGNOSIS To check if you have hypertension, your health care provider will measure your blood pressure while you are seated, with your arm held at the level of your heart. It should be measured at least twice using the same arm. Certain conditions can cause a difference in blood pressure between your right and left arms. A blood pressure reading that is higher than normal on one occasion does not mean that you need treatment. If  it is not clear whether you have high blood pressure, you may be asked to return on a different day to have your blood pressure checked again. Or, you may be asked to monitor your blood pressure at home for 1 or more weeks. TREATMENT Treating high blood pressure includes making lifestyle changes and possibly taking medicine. Living a healthy lifestyle can help lower high blood pressure. You may need to change some of your habits. Lifestyle changes may include:  Following the DASH diet. This diet is high in fruits, vegetables, and whole grains. It is low in salt, red meat, and added sugars.  Keep your sodium intake below 2,300 mg per day.  Getting at least 30-45 minutes of aerobic exercise at least 4 times per week.  Losing weight if necessary.  Not smoking.  Limiting alcoholic beverages.  Learning ways to reduce stress. Your health care provider may prescribe medicine if lifestyle changes are not enough to get your blood pressure under control, and if one of the following is true:  You are 18-59 years of age and your systolic blood pressure is above 140.  You are 60 years of age or older, and your systolic blood pressure is above 150.  Your diastolic blood pressure is above 90.  You have diabetes, and your systolic blood pressure is over 140 or your diastolic blood pressure is over 90.  You have kidney disease and your blood pressure is above 140/90.  You have heart disease and your blood pressure is above 140/90. Your personal target blood pressure may vary depending on your medical conditions, your age, and other factors. HOME CARE INSTRUCTIONS    Have your blood pressure rechecked as directed by your health care provider.   Take medicines only as directed by your health care provider. Follow the directions carefully. Blood pressure medicines must be taken as prescribed. The medicine does not work as well when you skip doses. Skipping doses also puts you at risk for  problems.  Do not smoke.   Monitor your blood pressure at home as directed by your health care provider. SEEK MEDICAL CARE IF:   You think you are having a reaction to medicines taken.  You have recurrent headaches or feel dizzy.  You have swelling in your ankles.  You have trouble with your vision. SEEK IMMEDIATE MEDICAL CARE IF:  You develop a severe headache or confusion.  You have unusual weakness, numbness, or feel faint.  You have severe chest or abdominal pain.  You vomit repeatedly.  You have trouble breathing. MAKE SURE YOU:   Understand these instructions.  Will watch your condition.  Will get help right away if you are not doing well or get worse.   This information is not intended to replace advice given to you by your health care provider. Make sure you discuss any questions you have with your health care provider.   Document Released: 03/30/2005 Document Revised: 08/14/2014 Document Reviewed: 01/20/2013 Elsevier Interactive Patient Education 2016 Elsevier Inc.  

## 2015-08-23 ENCOUNTER — Telehealth: Payer: Self-pay | Admitting: Cardiology

## 2015-08-23 NOTE — Telephone Encounter (Signed)
[facility] on 09/19/15 with Dr Antoine PocheHochrein.  [facility] ([facility] for Dr Hochrein's schedule on 09/20/15. lp

## 2015-09-18 NOTE — Progress Notes (Signed)
Error   This encounter was created in error - please disregard. 

## 2015-09-19 ENCOUNTER — Encounter: Payer: PRIVATE HEALTH INSURANCE | Admitting: Cardiology

## 2015-09-20 ENCOUNTER — Encounter: Payer: Self-pay | Admitting: *Deleted

## 2017-04-08 IMAGING — CR DG CHEST 2V
2 series · 2 of 2 positions shown · non-contrast
Comparison: None.

CLINICAL DATA: Chest pressure

EXAM:
CHEST  2 VIEW

[w chest pa]
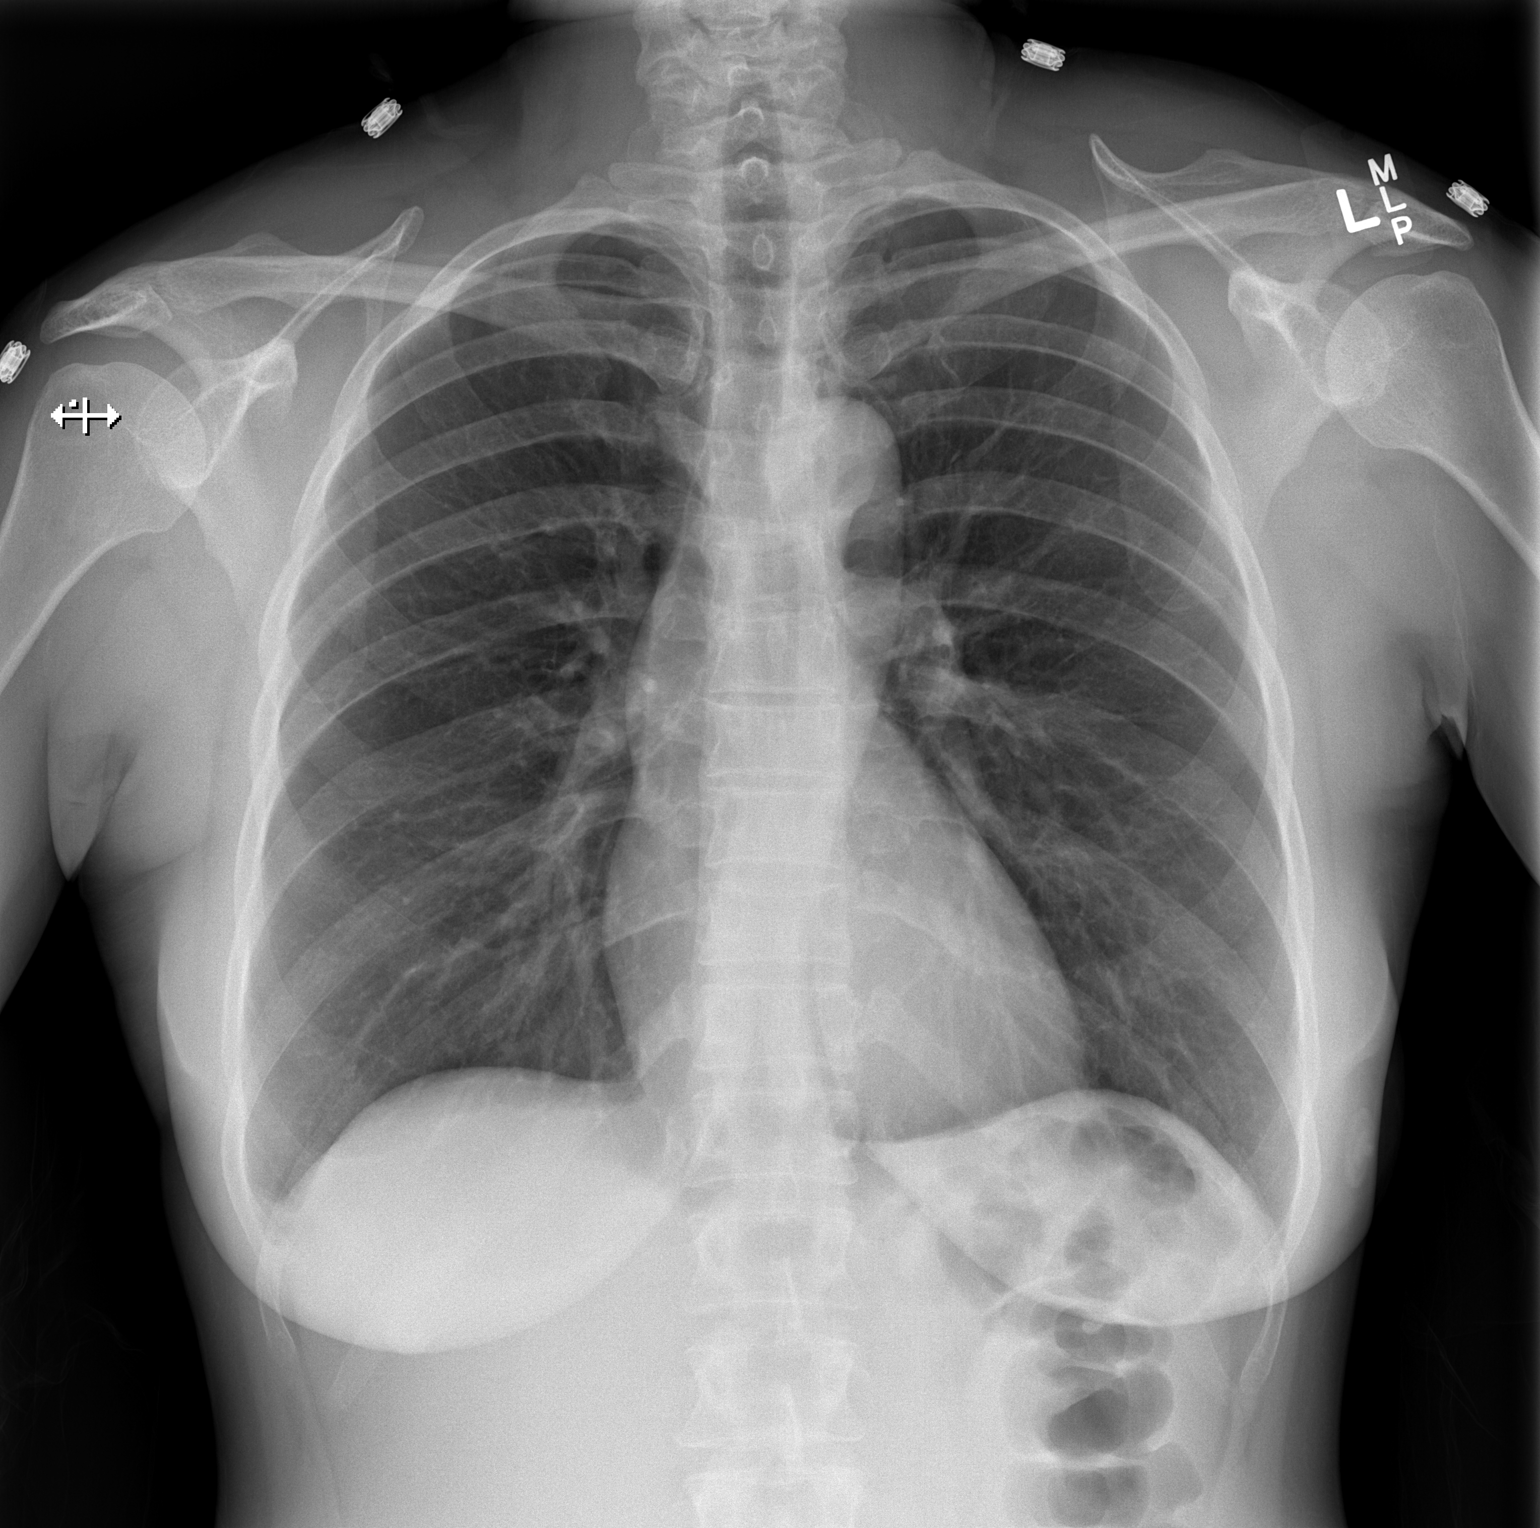

[w chest lat]
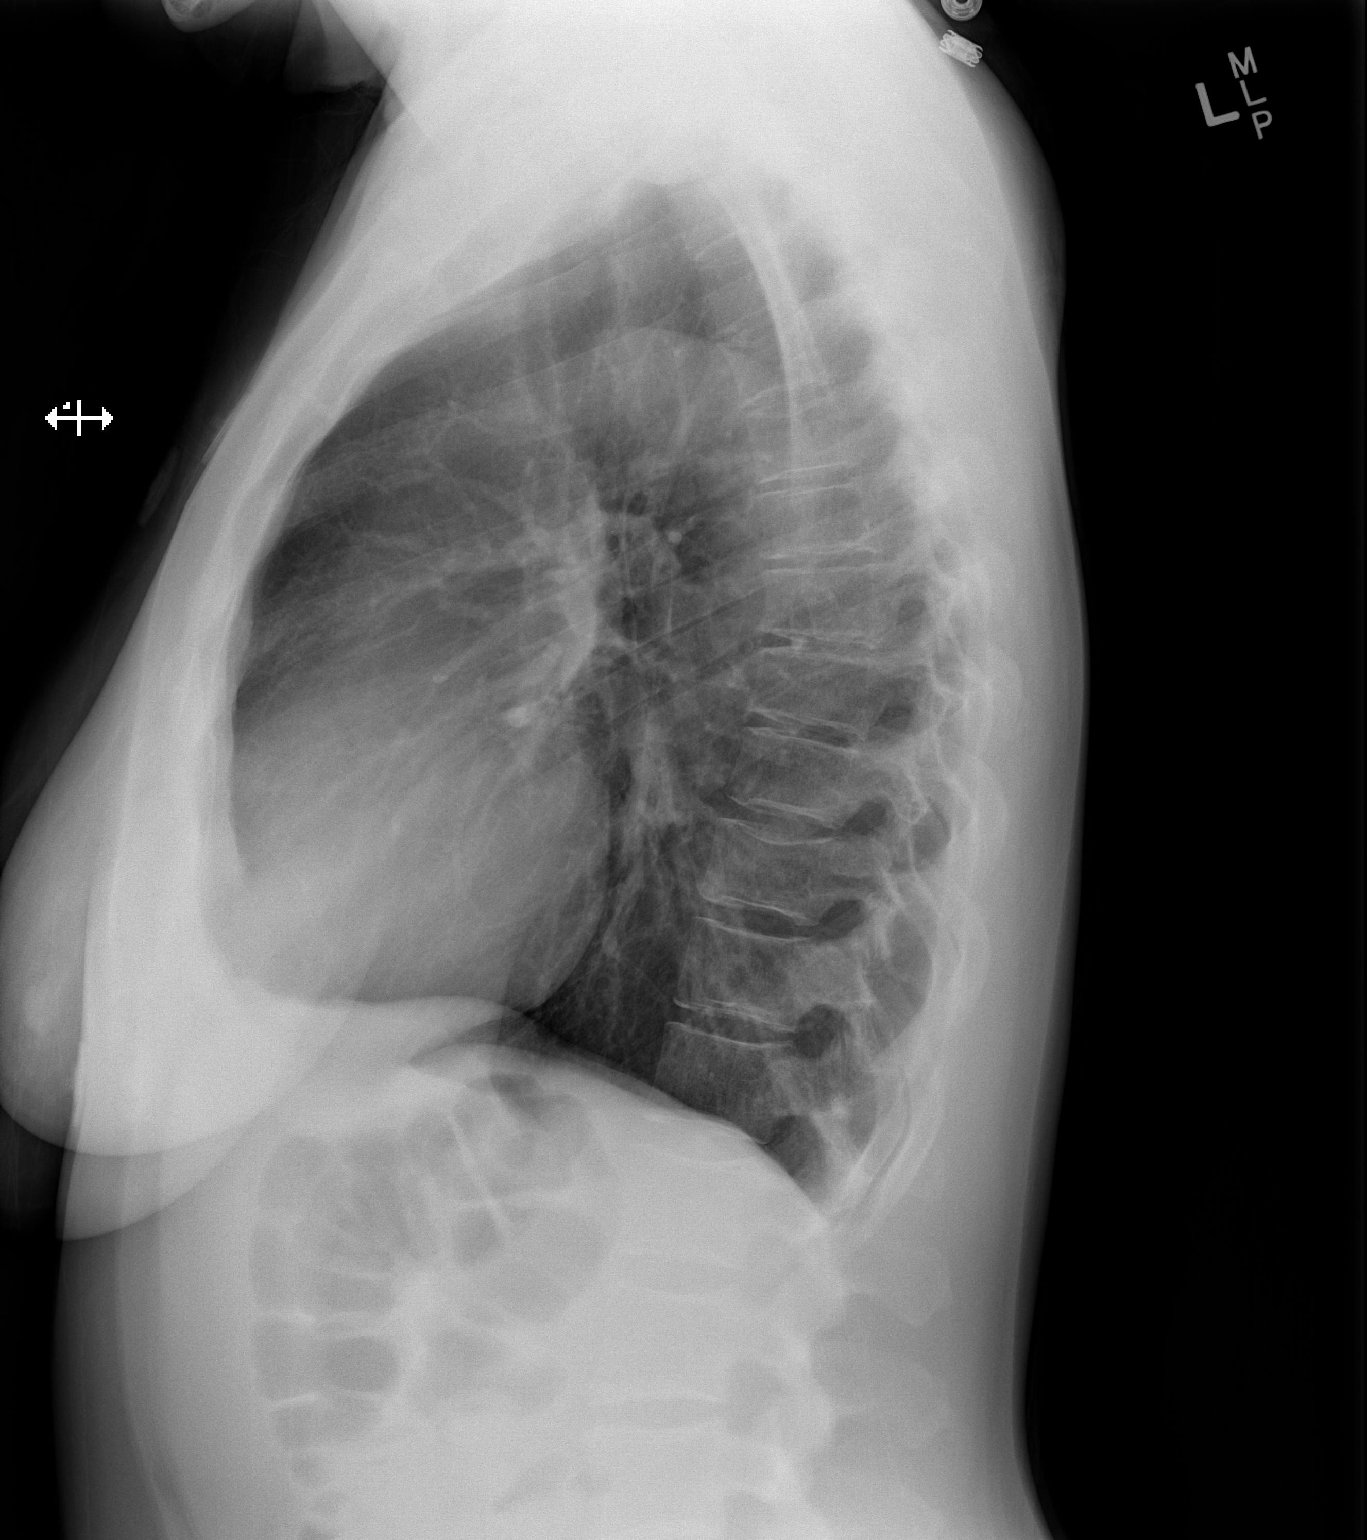

[2 of 2 positions shown; findings below may reference images not displayed]

FINDINGS: The heart size and mediastinal contours are within normal limits.
Both lungs are clear. The visualized skeletal structures are
unremarkable.
IMPRESSION: No active cardiopulmonary disease.

## 2017-11-18 ENCOUNTER — Other Ambulatory Visit: Payer: Self-pay

## 2017-11-18 ENCOUNTER — Emergency Department (HOSPITAL_COMMUNITY): Payer: PRIVATE HEALTH INSURANCE

## 2017-11-18 ENCOUNTER — Emergency Department (HOSPITAL_COMMUNITY)
Admission: EM | Admit: 2017-11-18 | Discharge: 2017-11-18 | Disposition: A | Discharge status: Home / Self Care | Payer: PRIVATE HEALTH INSURANCE | Attending: Emergency Medicine | Admitting: Emergency Medicine

## 2017-11-18 ENCOUNTER — Encounter (HOSPITAL_COMMUNITY): Payer: Self-pay | Admitting: *Deleted

## 2017-11-18 DIAGNOSIS — Z79899 Other long term (current) drug therapy: Secondary | ICD-10-CM | POA: Insufficient documentation

## 2017-11-18 DIAGNOSIS — Z87891 Personal history of nicotine dependence: Secondary | ICD-10-CM | POA: Insufficient documentation

## 2017-11-18 DIAGNOSIS — J45909 Unspecified asthma, uncomplicated: Secondary | ICD-10-CM | POA: Insufficient documentation

## 2017-11-18 DIAGNOSIS — I10 Essential (primary) hypertension: Secondary | ICD-10-CM

## 2017-11-18 LAB — COMPREHENSIVE METABOLIC PANEL
ALK PHOS: 57 U/L (ref 38–126)
ALT: 13 U/L (ref 0–44)
ANION GAP: 10 (ref 5–15)
AST: 18 U/L (ref 15–41)
Albumin: 3.9 g/dL (ref 3.5–5.0)
BUN: 10 mg/dL (ref 6–20)
CALCIUM: 9 mg/dL (ref 8.9–10.3)
CO2: 24 mmol/L (ref 22–32)
Chloride: 105 mmol/L (ref 98–111)
Creatinine, Ser: 0.78 mg/dL (ref 0.44–1.00)
GFR calc Af Amer: >60 mL/min (ref >60)
GFR calc non Af Amer: >60 mL/min (ref >60)
Glucose, Bld: 92 mg/dL (ref 70–99)
POTASSIUM: 3.8 mmol/L (ref 3.5–5.1)
SODIUM: 139 mmol/L (ref 135–145)
Total Bilirubin: 1 mg/dL (ref 0.3–1.2)
Total Protein: 6.8 g/dL (ref 6.5–8.1)

## 2017-11-18 LAB — CBC WITH DIFFERENTIAL/PLATELET
Abs Immature Granulocytes: 0 10*3/uL (ref 0.0–0.1)
BASOS ABS: 0.1 10*3/uL (ref 0.0–0.1)
Basophils Relative: 1 %
EOS ABS: 0.6 10*3/uL (ref 0.0–0.7)
Eosinophils Relative: 5 %
HCT: 41.7 % (ref 36.0–46.0)
HEMOGLOBIN: 14.3 g/dL (ref 12.0–15.0)
Immature Granulocytes: 0 %
Lymphocytes Relative: 15 %
Lymphs Abs: 1.9 10*3/uL (ref 0.7–4.0)
MCH: 29.6 pg (ref 26.0–34.0)
MCHC: 34.3 g/dL (ref 30.0–36.0)
MCV: 86.3 fL (ref 78.0–100.0)
MONO ABS: 1.1 10*3/uL — AB (ref 0.1–1.0)
Monocytes Relative: 9 %
Neutro Abs: 8.4 10*3/uL — ABNORMAL HIGH (ref 1.7–7.7)
Neutrophils Relative %: 70 %
Platelets: 325 10*3/uL (ref 150–400)
RBC: 4.83 MIL/uL (ref 3.87–5.11)
RDW: 12.8 % (ref 11.5–15.5)
WBC: 12 10*3/uL — AB (ref 4.0–10.5)

## 2017-11-18 MED ORDER — LISINOPRIL 10 MG PO TABS
10.0000 mg | ORAL_TABLET | Freq: Once | ORAL | Status: AC
  Administered 2017-11-18: 10 mg via ORAL
  Filled 2017-11-18: qty 1

## 2017-11-18 MED ORDER — LOSARTAN POTASSIUM 50 MG PO TABS: 50.0000 mg | ORAL_TABLET | Freq: Every day | ORAL | 0 refills | Status: AC

## 2017-11-18 NOTE — ED Triage Notes (Signed)
Pt in c/o hypertension, states she has been checking it at home and it was 222/140, states she is supposed to be taking losartan but has not been able to get to the doctor to get more refills, has not taken this in months regularly, pt c/o headache today

## 2017-11-18 NOTE — Discharge Instructions (Addendum)
Gerlene FeeKimberly Marszalek:  Thank you for allowing us to take care of you today.  We hope you begin feeling better soon.  To-Do: Please follow-up with your primary doctor Take losartan as prescribed Please return to the Emergency Department or call 911 if you experience chest pain, shortness of breath, severe pain, severe fever, altered mental status, or have any reason to think that you need emergency medical care.  Thank you again.  Hope you feel better soon.

## 2017-11-18 NOTE — ED Notes (Signed)
Patient reports headache has relieved some since waiting to for room.

## 2017-11-18 NOTE — ED Provider Notes (Signed)
[facility] EMERGENCY DEPARTMENT Provider Note   CSN: [id number] Arrival date & time: 11/18/17  1346     History   Chief Complaint Chief Complaint  Patient presents with  . Hypertension    HPI Holly Beck is a 42 y.o. female with hypertension and anemia who presents to the ED due to high blood pressure.  She says that she felt short of breath and she had a headache throughout the day.  She says that her headache is now almost resolved.  She took her blood pressure and found it to be 220/140.  She says that she still feels somewhat short of breath.  She denies chest pain.  She says she supposed to be on losartan and possible other blood pressure medications but has not taken them in a long time due to lack of insurance and money.  She has not seen a PCP in years.  HPI  Past Medical History:  Diagnosis Date  . Anemia   . Asthma   . Hypertension     Patient Active Problem List   Diagnosis Date Noted  . Accelerated hypertension 07/19/2015    Past Surgical History:  Procedure Laterality Date  . KNEE SURGERY    . TUBAL LIGATION       OB History   None      Home Medications    Prior to Admission medications   Medication Sig Start Date End Date Taking? Authorizing Provider  Ferrous Sulfate Dried (SLOW RELEASE IRON) 45 MG TBCR Take 45 mg by mouth daily.   Yes [provider]  Multiple Vitamin (MULTIVITAMIN WITH MINERALS) TABS tablet Take 1 tablet by mouth daily.   Yes [provider]  TURMERIC PO Take 2 capsules by mouth daily.   Yes [provider]  lisinopril (ZESTRIL) 10 MG tablet Take 1 tablet (10 mg total) by mouth daily. 07/19/15   Geoffery Lyons, MD  losartan (COZAAR) 50 MG tablet Take 1 tablet (50 mg total) by mouth daily. 11/18/17 12/18/17  Talitha Givens, MD  oxyCODONE-acetaminophen (PERCOCET) 5-325 MG tablet Take 1-2 tablets by mouth every 6 (six) hours as needed. Patient not taking: Reported on 07/23/2015 07/19/15   Geoffery Lyons, MD    Family History Family History  Problem Relation Age of Onset  . Cancer Mother     Social History Social History   Tobacco Use  . Smoking status: Former Games developer  . Smokeless tobacco: Current User  . Tobacco comment: Vapor   Substance Use Topics  . Alcohol use: No  . Drug use: No     Allergies   Percocet [oxycodone-acetaminophen]; Garlic; Morphine and related; and Onion   Review of Systems Review of Systems Review of Systems   Constitutional  Negative for fever  Negative for chills  HENT  Negative for ear pain  Negative for sore throat  Negative for difficultly swallowing  Eyes  Negative for eye pain  Negative for visual disturbance  Respiratory  +for shortness of breath  Negative for cough  CV  Negative for chest pain  Negative for leg swelling  Abdomen  Negative for abdominal pain  Negative for nausea  Negative for vomiting  MSK  Negative for extremity pain  Negative for back pain  Skin  Negative for rash  Negative for wound  Neuro  Negative for syncope  Negative for difficultly speaking  +headache  Psych  Negative for confusion   The remainder of the ROS was reviewed and negative except as documented  above.      Physical Exam Updated Vital Signs BP (!) 153/100   Pulse 80   Temp 98 F (36.7 C) (Oral)   Resp 16   Ht 5\' 3"  (1.6 m)   Wt 67.6 kg   LMP 11/03/2017 (Exact Date)   SpO2 100%   BMI 26.39 kg/m   Physical Exam Physical Exam Constitutional  Nursing notes reviewed  Vital signs reviewed  HEENT  No obvious trauma  Supple without meningismus, mass, or overt JVD  EOMI  No scleral icterus or injection  Respiratory  Effort normal  CTAB  No respiratory distress  CV  Normal rate  No obvious murmurs  No pitting edema  Equal pulses in all extremities  Chest not tender to palpation  Abdomen  Soft  Non-tender  Non-distended  No peritonitis  MSK  Atraumatic  No obvious  deformity  ROM appropriate  Skin  Warm  Dry  Neuro Component Findings  Mental Status Exam Alert and oriented Memory appropriate  Cranial Nerves   CN II Visual fields intact to confrontation  CN III, IV, VI PERRL EOMI No nystagmus.   CN V Facial sensation is normal No weakness of masticatory muscles  CN VII No facial weakness or asymmetry  CN VIII Auditory acuity grossly normal  CN IX and X Uvula is midline Palate elevates symmetrically  CN XI  Normal sternocleidomastoid and trapezius strength  CN XII The tongue is midline No tongue atrophy or fasciculations  Motor   Muscle Strength RUE: 5/5 flexion and extension RLE: 5/5 flexion and extension LUE: 5/5 flexion and extension LLE: 5/5 flexion and extension   Muscle Tone Normal bulk and tone  Coordination Intact finger-to-nose No tremor Negative Romberg  Sensation Intact to light touch  Gait Routine gait normal        Psychiatric  Mood and affect normal        ED Treatments / Results  Labs (all labs ordered are listed, but only abnormal results are displayed) Labs Reviewed  CBC WITH DIFFERENTIAL/PLATELET - Abnormal; Notable for the following components:      Result Value   WBC 12.0 (*)    Neutro Abs 8.4 (*)    Monocytes Absolute 1.1 (*)    All other components within normal limits  COMPREHENSIVE METABOLIC PANEL  I-STAT TROPONIN, ED    EKG None  Radiology Dg Chest 2 View  Result Date: 11/18/2017 CLINICAL DATA:  42 year old female with a history of hypertension and shortness of breath EXAM: CHEST - 2 VIEW COMPARISON:  08/26/2008, 07/23/2015 FINDINGS: The heart size and mediastinal contours are within normal limits. Both lungs are clear. The visualized skeletal structures are unremarkable. IMPRESSION: No active cardiopulmonary disease. Electronically Signed   By: Gilmer Mor D.O.   On: 11/18/2017 15:07    Procedures Procedures (including critical care time)  Medications Ordered in ED Medications   lisinopril (PRINIVIL,ZESTRIL) tablet 10 mg (10 mg Oral Given 11/18/17 2014)     Initial Impression / Assessment and Plan / ED Course  I have reviewed the triage vital signs and the nursing notes.  Pertinent labs & imaging results that were available during my care of the patient were reviewed by me and considered in my medical decision making (see chart for details).  Clinical Course as of Nov 19 2023  Thu Nov 18, 2017  1938 Holly Beck presents due to shortness of breath and concern for high blood pressure. On exam, she is hypertensive but HDS.  Her neurological exam  is normal.  She can ambulate without difficulty.  There are no signs of acute stroke.  Labs reveal no signs of AKI.  Her hemoglobin is normal.  Her platelets are normal.  She has no significant abnormalities on CMP.  There are no signs of end organ damage.  Her troponin is not elevated.  Her ECG is nonischemic. CXR shows no signs of cardiopulmonary disease. She says that she is supposed to be on blood pressure medicines.  She believes that she is supposed be on losartan.  Per record review, the patient is supposed to be on lisinopril.  She was given lisinopril daily.  I provided resources for her to establish care with a PCP.  I also prescribed her a 30-day supply of losartan (found on record review).  I believe that she is safe for discharge.  Her presentation is consistent with hypertensive urgency secondary to medication noncompliance.  I provided ED return precautions.   [NA]    Clinical Course User Index [NA] Talitha GivensAshburn, Dori Devino, MD    Final Clinical Impressions(s) / ED Diagnoses   Final diagnoses:  Hypertension, unspecified type    ED Discharge Orders         Ordered    losartan (COZAAR) 50 MG tablet  Daily     11/18/17 2024           Talitha GivensAshburn, Keil Pickering, MD 11/18/17 2027    Charlynne PanderYao, David Hsienta, MD 11/19/17 [phone]

## 2017-11-18 NOTE — ED Provider Notes (Signed)
Patient placed in Quick Look pathway, seen and evaluated   Chief Complaint: Shortness of breath  HPI:   Patient was at work when she began to feel lightheaded, headache, and short of breath.  She took her blood pressure and noted it was 222/140.  She continues to feel some shortness of breath.  States, "Last time this happened there was fluid on my lungs."  Denies chest pain, syncope, vomiting.  ROS: Shortness of breath (one)  Physical Exam:   Gen: No distress  Neuro: Awake and Alert  Skin: Warm    Focused Exam:   No diaphoresis.  No pallor.  Pulmonary: No increased work of breathing.  Speaks in full sentences without difficulty.  Lung sounds clear.  No tachypnea.  Cardiac: Normal rate and regular. Peripheral pulses intact.  Neurologic: Patient moves all 4 extremities.  MSK: No peripheral edema.   Initiation of care has begun. The patient has been counseled on the process, plan, and necessity for staying for the completion/evaluation, and the remainder of the medical screening examination   Concepcion LivingJoy, Wise Fees C, PA-C 11/18/17 1428    Tilden Fossaees, Elizabeth, MD 11/18/17 [phone]

## 2018-03-26 ENCOUNTER — Encounter (HOSPITAL_COMMUNITY): Payer: Self-pay | Admitting: Emergency Medicine

## 2018-03-26 ENCOUNTER — Emergency Department (HOSPITAL_COMMUNITY)
Admission: EM | Admit: 2018-03-26 | Discharge: 2018-04-13 | Disposition: E | Payer: Medicaid Other | Attending: Emergency Medicine | Admitting: Emergency Medicine

## 2018-03-26 DIAGNOSIS — I10 Essential (primary) hypertension: Secondary | ICD-10-CM | POA: Insufficient documentation

## 2018-03-26 DIAGNOSIS — J45998 Other asthma: Secondary | ICD-10-CM | POA: Diagnosis not present

## 2018-03-26 DIAGNOSIS — I469 Cardiac arrest, cause unspecified: Secondary | ICD-10-CM

## 2018-03-26 DIAGNOSIS — Z87891 Personal history of nicotine dependence: Secondary | ICD-10-CM | POA: Insufficient documentation

## 2018-03-26 DIAGNOSIS — Z79899 Other long term (current) drug therapy: Secondary | ICD-10-CM | POA: Diagnosis not present

## 2018-03-26 LAB — I-STAT VENOUS BLOOD GAS, ED
ACID-BASE DEFICIT: 10 mmol/L — AB (ref 0.0–2.0)
Bicarbonate: 21.7 mmol/L (ref 20.0–28.0)
O2 SAT: 29 %
PO2 VEN: 28 mmHg — AB (ref 32.0–45.0)
Patient temperature: 37
TCO2: 24 mmol/L (ref 22–32)
pCO2, Ven: 84 mmHg (ref 44.0–60.0)
pH, Ven: 7.021 — CL (ref 7.250–7.430)

## 2018-03-26 LAB — I-STAT CHEM 8, ED
BUN: 10 mg/dL (ref 6–20)
CALCIUM ION: 1.04 mmol/L — AB (ref 1.15–1.40)
CHLORIDE: 105 mmol/L (ref 98–111)
Creatinine, Ser: 1 mg/dL (ref 0.44–1.00)
Glucose, Bld: 306 mg/dL — ABNORMAL HIGH (ref 70–99)
HEMATOCRIT: 43 % (ref 36.0–46.0)
Hemoglobin: 14.6 g/dL (ref 12.0–15.0)
POTASSIUM: 2.7 mmol/L — AB (ref 3.5–5.1)
SODIUM: 141 mmol/L (ref 135–145)
TCO2: 18 mmol/L — ABNORMAL LOW (ref 22–32)

## 2018-03-26 LAB — COMPREHENSIVE METABOLIC PANEL
ALK PHOS: 80 U/L (ref 38–126)
ALT: 350 U/L — ABNORMAL HIGH (ref 0–44)
ANION GAP: 23 — AB (ref 5–15)
AST: 374 U/L — ABNORMAL HIGH (ref 15–41)
Albumin: 3 g/dL — ABNORMAL LOW (ref 3.5–5.0)
BUN: 7 mg/dL (ref 6–20)
CALCIUM: 8.3 mg/dL — AB (ref 8.9–10.3)
CO2: 15 mmol/L — AB (ref 22–32)
Chloride: 103 mmol/L (ref 98–111)
Creatinine, Ser: 1.16 mg/dL — ABNORMAL HIGH (ref 0.44–1.00)
GFR calc non Af Amer: 57 mL/min — ABNORMAL LOW (ref >60)
Glucose, Bld: 321 mg/dL — ABNORMAL HIGH (ref 70–99)
Potassium: 2.9 mmol/L — ABNORMAL LOW (ref 3.5–5.1)
SODIUM: 141 mmol/L (ref 135–145)
TOTAL PROTEIN: 5.4 g/dL — AB (ref 6.5–8.1)
Total Bilirubin: 0.8 mg/dL (ref 0.3–1.2)

## 2018-03-26 LAB — CBC
HCT: 45.5 % (ref 36.0–46.0)
Hemoglobin: 13.8 g/dL (ref 12.0–15.0)
MCH: 29.7 pg (ref 26.0–34.0)
MCHC: 30.3 g/dL (ref 30.0–36.0)
MCV: 97.8 fL (ref 80.0–100.0)
NRBC: 0.9 % — AB (ref 0.0–0.2)
PLATELETS: 217 10*3/uL (ref 150–400)
RBC: 4.65 MIL/uL (ref 3.87–5.11)
RDW: 11.8 % (ref 11.5–15.5)
WBC: 12.3 10*3/uL — AB (ref 4.0–10.5)

## 2018-03-26 LAB — I-STAT CG4 LACTIC ACID, ED: LACTIC ACID, VENOUS: 12.41 mmol/L — AB (ref 0.5–1.9)

## 2018-03-26 LAB — I-STAT TROPONIN, ED: Troponin i, poc: 0.1 ng/mL (ref 0.00–0.08)

## 2018-03-26 MED ORDER — EPINEPHRINE PF 1 MG/ML IJ SOLN
0.5000 ug/min | INTRAVENOUS | Status: AC
  Administered 2018-03-26: 5 ug/min via INTRAVENOUS
  Filled 2018-03-26: qty 4

## 2018-03-26 MED ORDER — SODIUM BICARBONATE 8.4 % IV SOLN
INTRAVENOUS | Status: DC
  Administered 2018-03-27: 01:00:00 via INTRAVENOUS
  Filled 2018-03-26 (×2): qty 150

## 2018-03-26 MED ORDER — SODIUM CHLORIDE 0.9 % IV SOLN
INTRAVENOUS | Status: AC | PRN
  Administered 2018-03-26 (×3): 1000 mL via INTRAVENOUS

## 2018-03-26 MED ORDER — POTASSIUM CHLORIDE 10 MEQ/100ML IV SOLN
10.0000 meq | Freq: Once | INTRAVENOUS | Status: AC
  Administered 2018-03-27: 10 meq via INTRAVENOUS
  Filled 2018-03-26: qty 100

## 2018-03-26 MED ORDER — SODIUM BICARBONATE 8.4 % IV SOLN
INTRAVENOUS | Status: AC
  Filled 2018-03-26: qty 100

## 2018-03-26 MED ORDER — MAGNESIUM SULFATE 50 % IJ SOLN
INTRAMUSCULAR | Status: AC | PRN
  Administered 2018-03-26 (×2): 2 g via INTRAVENOUS

## 2018-03-26 MED ORDER — LIDOCAINE IN D5W 4-5 MG/ML-% IV SOLN
INTRAVENOUS | Status: AC | PRN
  Administered 2018-03-26: 100 mg/min via INTRAVENOUS

## 2018-03-26 MED ORDER — SODIUM CHLORIDE 0.9 % IV BOLUS
1000.0000 mL | Freq: Once | INTRAVENOUS | Status: AC
  Administered 2018-03-26: 1000 mL via INTRAVENOUS

## 2018-03-26 MED ORDER — SODIUM BICARBONATE 8.4 % IV SOLN
INTRAVENOUS | Status: AC | PRN
  Administered 2018-03-26: 50 meq via INTRAVENOUS

## 2018-03-26 MED ORDER — NOREPINEPHRINE 4 MG/250ML-% IV SOLN
0.0000 ug/min | INTRAVENOUS | Status: DC
  Administered 2018-03-26: 20 ug/min via INTRAVENOUS
  Filled 2018-03-26: qty 250

## 2018-03-26 MED ORDER — ATROPINE SULFATE 1 MG/ML IJ SOLN
INTRAMUSCULAR | Status: AC | PRN
  Administered 2018-03-26 (×3): 1 mg via INTRAVENOUS

## 2018-03-26 MED ORDER — POTASSIUM CHLORIDE 10 MEQ/100ML IV SOLN
10.0000 meq | Freq: Once | INTRAVENOUS | Status: AC
  Administered 2018-03-26: 10 meq via INTRAVENOUS
  Filled 2018-03-26: qty 100

## 2018-03-26 MED ORDER — POTASSIUM CHLORIDE 10 MEQ/100ML IV SOLN
10.0000 meq | INTRAVENOUS | Status: AC
  Administered 2018-03-26: 10 meq via INTRAVENOUS
  Filled 2018-03-26: qty 100

## 2018-03-26 MED ORDER — CALCIUM CHLORIDE 10 % IV SOLN
INTRAVENOUS | Status: AC
  Filled 2018-03-26: qty 20

## 2018-03-26 MED ORDER — NOREPINEPHRINE 4 MG/250ML-% IV SOLN
INTRAVENOUS | Status: AC
  Filled 2018-03-26: qty 250

## 2018-03-26 MED ORDER — EPINEPHRINE PF 1 MG/10ML IJ SOSY
PREFILLED_SYRINGE | INTRAMUSCULAR | Status: AC | PRN
  Administered 2018-03-26 (×3): 1 via INTRAVENOUS

## 2018-03-26 MED ORDER — EPINEPHRINE PF 1 MG/ML IJ SOLN
0.5000 ug/min | Freq: Once | INTRAVENOUS | Status: AC
  Administered 2018-03-26: 100 ug/min via INTRAVENOUS
  Filled 2018-03-26: qty 4

## 2018-03-26 MED ORDER — EPINEPHRINE PF 1 MG/10ML IJ SOSY
PREFILLED_SYRINGE | INTRAMUSCULAR | Status: AC
  Filled 2018-03-26: qty 30

## 2018-03-26 NOTE — ED Notes (Signed)
Critical care paged to Dr Jodi MourningZavitz

## 2018-03-26 NOTE — ED Triage Notes (Signed)
Brought by ems from home.  Per family heard patient gurgle from the other room and then she fell out.  In vtach on arrival via ems.  Per family patient has been under a lot of stress

## 2018-03-27 ENCOUNTER — Emergency Department (HOSPITAL_COMMUNITY): Payer: Medicaid Other

## 2018-03-27 MED ORDER — VASOPRESSIN 20 UNIT/ML IV SOLN
0.0300 [IU]/min | INTRAVENOUS | Status: DC
  Filled 2018-03-27: qty 2

## 2018-03-27 MED ORDER — PHENYLEPHRINE 40 MCG/ML (10ML) SYRINGE FOR IV PUSH (FOR BLOOD PRESSURE SUPPORT)
PREFILLED_SYRINGE | INTRAVENOUS | Status: AC
  Filled 2018-03-27: qty 10

## 2018-03-28 MED FILL — Medication: Qty: 1 | Status: AC

## 2018-04-13 NOTE — Consult Note (Addendum)
Cardiology Consultation Note    Patient ID: Holly Beck, MRN: [id number], DOB/AGE: May 23, 1975 43 y.o. Admit date: 03/17/2018   Date of Consult: 2018-04-25 Primary Physician: Patient, No Pcp Per Primary Cardiologist: None  Chief Complaint: Cardiac arrest Reason for Consultation: Cardiac arrest Requesting MD: Dr. Blane Ohara  HPI: Holly Beck is a 43 y.o. female with a history of HTN who presents s/p witnessed cardiac arrest. Holly Beck was in her [employer] watching TV when her family heard her gurgle and fall. She has been under stress as of late. She underwent CPR immediately and when I evaluated her in the ER she had been having CPR off and on for close to an hour and a half with intermittent ROSC followed by repeated loss of pulses. She had undergone defibrillation a total of 9 times when I evaluated her for VT/VF, but also had PEA more recently in her course. Her pupils are fixed and dilated and she is not overbreathing the ventilator. Her ECG is consistent with severe global ischemia.  On labs, her initial troponin was 0.10, K+2.7 and lactate >12. Her pH is 7.02.   Past Medical History:  Diagnosis Date  . Anemia   . Asthma   . Hypertension       Surgical History:  Past Surgical History:  Procedure Laterality Date  . KNEE SURGERY    . TUBAL LIGATION       Home Meds: Prior to Admission medications   Medication Sig Start Date End Date Taking? Authorizing Provider  Ferrous Sulfate Dried (SLOW RELEASE IRON) 45 MG TBCR Take 45 mg by mouth daily.    [provider]  lisinopril (ZESTRIL) 10 MG tablet Take 1 tablet (10 mg total) by mouth daily. Patient not taking: Reported on 11/18/2017 07/19/15   Geoffery Lyons, MD  losartan (COZAAR) 50 MG tablet Take 1 tablet (50 mg total) by mouth daily. 11/18/17 12/18/17  Talitha Givens, MD  Multiple Vitamin (MULTIVITAMIN WITH MINERALS) TABS tablet Take 1 tablet by mouth daily.    [provider]  oxyCODONE-acetaminophen (PERCOCET)  5-325 MG tablet Take 1-2 tablets by mouth every 6 (six) hours as needed. Patient not taking: Reported on 07/23/2015 07/19/15   Geoffery Lyons, MD  Potassium 99 MG TABS Take 1 tablet by mouth daily.    [provider]  TURMERIC PO Take 2 capsules by mouth daily.    [provider]    Inpatient Medications:  . calcium chloride      . EPINEPHrine      . sodium bicarbonate       . sodium chloride 1,000 mL (04/04/2018 2348)  . lidocaine 100 mg/min (03/19/2018 2321)  . norepinephrine    . norepinephrine (LEVOPHED) Adult infusion 20 mcg/min (03/16/2018 2239)  . potassium chloride 10 mEq (04/07/2018 2305)  . potassium chloride 10 mEq (04/01/2018 2340)  . potassium chloride    .  sodium bicarbonate  infusion 1000 mL      Allergies:  Allergies  Allergen Reactions  . Percocet [Oxycodone-Acetaminophen] Shortness Of Breath and Itching  . Garlic Other (See Comments)    Migraine   . Morphine And Related Nausea And Vomiting  . Onion     Migraine    Social History   Socioeconomic History  . Marital status: Single    Spouse name: Not on file  . Number of children: Not on file  . Years of education: Not on file  . Highest education level: Not on file  Occupational History  .  Not on file  Social Needs  . Financial resource strain: Not on file  . Food insecurity:    Worry: Not on file    Inability: Not on file  . Transportation needs:    Medical: Not on file    Non-medical: Not on file  Tobacco Use  . Smoking status: Former Games developermoker  . Smokeless tobacco: Current User  . Tobacco comment: Vapor   Substance and Sexual Activity  . Alcohol use: No  . Drug use: No  . Sexual activity: Not on file  Lifestyle  . Physical activity:    Days per week: Not on file    Minutes per session: Not on file  . Stress: Not on file  Relationships  . Social connections:    Talks on phone: Not on file    Gets together: Not on file    Attends religious service: Not on file    Active member of  club or organization: Not on file    Attends meetings of clubs or organizations: Not on file    Relationship status: Not on file  . Intimate partner violence:    Fear of current or ex partner: Not on file    Emotionally abused: Not on file    Physically abused: Not on file    Forced sexual activity: Not on file  Other Topics Concern  . Not on file  Social History Narrative  . Not on file     Family History  Problem Relation Age of Onset  . Cancer Mother      Review of Systems: Unable to obtain as patient is unresponsive.  Labs: No results for input(s): CKTOTAL, CKMB, TROPONINI in the last 72 hours. Lab Results  Component Value Date   WBC 12.3 (H) 09/15/17   HGB 14.6 09/15/17   HCT 43.0 09/15/17   MCV 97.8 09/15/17   PLT 217 09/15/17    Recent Labs  Lab 12/04/17 2243 12/04/17 2245  NA 141 141  K 2.9* 2.7*  CL 103 105  CO2 15*  --   BUN 7 10  CREATININE 1.16* 1.00  CALCIUM 8.3*  --   PROT 5.4*  --   BILITOT 0.8  --   ALKPHOS 80  --   ALT 350*  --   AST 374*  --   GLUCOSE 321* 306*   No results found for: CHOL, HDL, LDLCALC, TRIG No results found for: DDIMER  Radiology/Studies:  No results found.  Wt Readings from Last 3 Encounters:  12/04/17 67.6 kg  11/18/17 67.6 kg  07/19/15 65.6 kg    EKG:  Severe global ischemia with wide QRS complexes  Physical Exam: Blood pressure (!) 121/97, pulse 93, temperature (!) 95.4 F (35.2 C), temperature source Temporal, resp. rate (!) 26, height 5\' 3"  (1.6 m), weight 67.6 kg, SpO2 92 %. Body mass index is 26.4 kg/m. General: intubated, no purposeful movements Head: Pupils fixed and dilated Lungs: bag mask ventilation Heart: RRR with S1 S2. No murmurs, rubs, or gallops appreciated. Abdomen: Soft, non-tender, non-distended with normoactive bowel sounds. No hepatomegaly. No rebound/guarding. No obvious abdominal masses. Msk:  Strength and tone appear normal for age. Extremities: No clubbing or cyanosis.  No edema.  Distal pedal pulses are 2+ and equal bilaterally. Neuro: No purposeful movements, unresponsive    Assessment and Plan  43 y/o F s/p cardiac arrest. Currently with ROSC but fixed dilated pupils, not overbreathing vent or making purposeful movements, hypotensive maxed on epinephrine with lactate 12, pH ~  7 and prolonged CPR. Given her current instability and guarded neurologic status, the utility of an emergent cardiac catheterization at this time would be limited. -Appreciate the excellent care by the Emergency Room team. -Agree with aggressive electrolyte repletion as you are. -Critical care consult -Formal neuro evaluation  Thank you for this consult. The cardiology consult team will continue to follow.  Signed, Gorden Harms, MD 04/04/2018, 12:03 AM

## 2018-04-13 NOTE — ED Notes (Addendum)
2232-arrived in ER.  CPR in progress.   2232 epi 1mg  given CPR continued.  2230 Pulse check, shocked at 200joules, CPR continued.  2231 bicarb 1 amp given.  2232 calcium and lidocaine 100mg  given.  2233 Pulse check positive.  2234 another amp calcium given, pulse lost resumed CPR.  Epi 1mg  given.  2236 pulse check no pulse.  Shocked at 200Joules.  2238 pulse check.  Positive pulse, Epi 1mg  given now.  Manual bp 78/50 on right arm.  2239 bicarb 2 amps given and levo 20mg  started.  2242 mag 2 gm given.  Lost pulses, shocked at 200joules.  2244 no pulse CPR resumed, rhythm shows PEA.  2247 PEA continued CPR.  2249 positive pulses.  2251 positive pulses.  2254 Levo @ 50, Positive pulse noted.  2300 reintubated 7.5 tube 3 at the lip with positive color change and bil breath sounds heard.  2300 light pulse.  Atropine given.  2304 lost pulses epi 1mg  given, epi drip started at 5/hr Potassium run started.  2305 epi 1mg  given.  2311 epi increaed to 20mg /hr, continued CPR.  2312 pulse check, shocked using 2 defibrillators. 2315 epi 1mg  given, 2316 pulse check with positive pulses.  2317 atropine given, 2319 pulse lost showing vfib on the monitor shocked with 2 defib again.  LIdocaine 100mg  given.  2322 pulse check, no pulse CPR resumed.  2324 no pulse, shocked.  2326 No pulse shocked using 2 defibrillators.  CPR resumed.  2327 epi 1mg , 2328 no pulse CPR continued.  2332 pulse check shows bradycardia, EPI increased to 75/hr.  2335 No pulse CPR resumed.  2337 Pulse check positive pulses.  2338 atropine 1mg  given.  2341 art line placed to Right groin.  2348 NS bolus given and epi drip increased to 100/hr.  Pupils fixed and dilated at this time.  2353 bicarb 2 amps given.  2353 18 french OG inserted.  2356 3rd potassium run started.  0008 bradycardic, atropine given, resumed CPR.  14 fr temp foley placed.  0010 epi 1mg .  0011 pulse check v-fib, shocked, cpr resumed.  0013 pulse check negative, brady on monitor.  CPR continued.   0015 pulse check V-fib shocked.  0016 weak pulse showing bradycardia on monitor.  BP 61/41 at this time.  [id number] Per Dr. Jodi MourningZavitz if patient loses pulses again we will not resume CPR.  Family at the bedside.  Continues to run brady.  Lost pulses completely at 0102.  Pronounced by Dr. Gevena BarreSarah Wendel.

## 2018-04-13 NOTE — Progress Notes (Signed)
   04-24-2018 0700  Clinical Encounter Type  Visited With Patient and family together  Visit Type ED  Referral From Nurse  Spiritual Encounters  Spiritual Needs Grief support;Prayer  Stress Factors  Family Stress Factors Exhausted  Responded to page for CPR in progress. Met with family and staff worked on patient for 2 hours. Family pastor was present. We provided grief support and prayer. Family was able to say goodbye to their loved one. Provided the ministry of comfort and care.

## 2018-04-13 NOTE — ED Notes (Signed)
Family at the bedside.

## 2018-04-13 NOTE — ED Provider Notes (Addendum)
[facility] EMERGENCY DEPARTMENT Provider Note   CSN: [id number] Arrival date & time: 03/25/2018  2232     History   Chief Complaint Chief Complaint  Patient presents with  . Cardiac Arrest    HPI Holly Beck is a 43 y.o. female.   Cardiac Arrest  Witnessed by:  Family member Incident location:  Home Condition upon EMS arrival:  Agonal respirations Pulse:  Absent Initial cardiac rhythm per EMS:  Ventricular tachycardia Treatments prior to arrival:  Intubation and ACLS protocol Medications given prior to ED:  Amiodarone Airway:  Intubation prior to arrival and spontaneous respirations Rhythm on admission to ED:  Asystole Risk factors comment:  High blood pressure   Past Medical History:  Diagnosis Date  . Anemia   . Asthma   . Hypertension     Patient Active Problem List   Diagnosis Date Noted  . Accelerated hypertension 07/19/2015    Past Surgical History:  Procedure Laterality Date  . KNEE SURGERY    . TUBAL LIGATION       OB History   No obstetric history on file.      Home Medications    Prior to Admission medications   Medication Sig Start Date End Date Taking? Authorizing Provider  Ferrous Sulfate Dried (SLOW RELEASE IRON) 45 MG TBCR Take 45 mg by mouth daily.    [provider]  lisinopril (ZESTRIL) 10 MG tablet Take 1 tablet (10 mg total) by mouth daily. Patient not taking: Reported on 11/18/2017 07/19/15   Geoffery Lyons, MD  losartan (COZAAR) 50 MG tablet Take 1 tablet (50 mg total) by mouth daily. 11/18/17 12/18/17  Talitha Givens, MD  Multiple Vitamin (MULTIVITAMIN WITH MINERALS) TABS tablet Take 1 tablet by mouth daily.    [provider]  oxyCODONE-acetaminophen (PERCOCET) 5-325 MG tablet Take 1-2 tablets by mouth every 6 (six) hours as needed. Patient not taking: Reported on 07/23/2015 07/19/15   Geoffery Lyons, MD  Potassium 99 MG TABS Take 1 tablet by mouth daily.    [provider]  TURMERIC PO  Take 2 capsules by mouth daily.    [provider]    Family History Family History  Problem Relation Age of Onset  . Cancer Mother     Social History Social History   Tobacco Use  . Smoking status: Former Games developer  . Smokeless tobacco: Current User  . Tobacco comment: Vapor   Substance Use Topics  . Alcohol use: No  . Drug use: No     Allergies   Percocet [oxycodone-acetaminophen]; Garlic; Morphine and related; and Onion   Review of Systems Review of Systems  Unable to perform ROS: Acuity of condition     Physical Exam Updated Vital Signs BP (!) 65/49   Pulse 93   Temp (!) 93.2 F (34 C)   Resp 14   Ht 5\' 3"  (1.6 m)   Wt 67.6 kg   SpO2 92%   BMI 26.40 kg/m   Physical Exam Vitals signs and nursing note reviewed.  Constitutional:      Appearance: She is obese.     Interventions: She is intubated. Backboard in place.  HENT:     Head: Normocephalic and atraumatic.     Mouth/Throat:     Comments: ET tube in place Eyes:     Pupils: Pupils are equal, round, and reactive to light.  Neck:     Musculoskeletal: Normal range of motion and neck supple.  Cardiovascular:  Rate and Rhythm: Rhythm irregularly irregular.     Comments: No femoral pulse upon arrival Pulmonary:     Effort: She is intubated.     Comments: Breathing independently Being bagged Abdominal:     General: Abdomen is flat. There is no distension.  Musculoskeletal:        General: No swelling.  Skin:    Capillary Refill: Capillary refill takes more than 3 seconds.     Coloration: Skin is pale.    ED Treatments / Results  Labs (all labs ordered are listed, but only abnormal results are displayed) Labs Reviewed  COMPREHENSIVE METABOLIC PANEL - Abnormal; Notable for the following components:      Result Value   Potassium 2.9 (*)    CO2 15 (*)    Glucose, Bld 321 (*)    Creatinine, Ser 1.16 (*)    Calcium 8.3 (*)    Total Protein 5.4 (*)    Albumin 3.0 (*)    AST 374 (*)     ALT 350 (*)    GFR calc non Af Amer 57 (*)    Anion gap 23 (*)    All other components within normal limits  CBC - Abnormal; Notable for the following components:   WBC 12.3 (*)    nRBC 0.9 (*)    All other components within normal limits  I-STAT CHEM 8, ED - Abnormal; Notable for the following components:   Potassium 2.7 (*)    Glucose, Bld 306 (*)    Calcium, Ion 1.04 (*)    TCO2 18 (*)    All other components within normal limits  I-STAT CG4 LACTIC ACID, ED - Abnormal; Notable for the following components:   Lactic Acid, Venous 12.41 (*)    All other components within normal limits  I-STAT TROPONIN, ED - Abnormal; Notable for the following components:   Troponin i, poc 0.10 (*)    All other components within normal limits  I-STAT VENOUS BLOOD GAS, ED - Abnormal; Notable for the following components:   pH, Ven 7.021 (*)    pCO2, Ven 84.0 (*)    pO2, Ven 28.0 (*)    Acid-base deficit 10.0 (*)    All other components within normal limits  I-STAT CG4 LACTIC ACID, ED    EKG None  Radiology No results found.  Procedures Procedure Name: Intubation Date/Time: 03/21/2018 1:06 AM Performed by: Joaquin Courts, MD Laryngoscope Size: Glidescope and 3 Grade View: Grade I Number of attempts: 1 Airway Equipment and Method: Video-laryngoscopy Placement Confirmation: ETT inserted through vocal cords under direct vision and Breath sounds checked- equal and bilateral Secured at: 24 cm Tube secured with: ETT holder    .Central Line Date/Time: 04/06/2018 1:07 AM Performed by: Joaquin Courts, MD Authorized by: Blane Ohara, MD   Consent:    Consent obtained:  Emergent situation Pre-procedure details:    Skin preparation:  2% chlorhexidine Procedure details:    Location:  L femoral   Patient position:  Flat   Procedural supplies:  Triple lumen   Catheter size:  7.5 Fr   Landmarks identified: yes     Ultrasound guidance: yes     Number of attempts:  1   Successful  placement: yes   Post-procedure details:    Post-procedure:  Dressing applied and line sutured   Assessment:  Blood return through all ports and free fluid flow   Patient tolerance of procedure:  Tolerated well, no immediate complications ARTERIAL LINE Date/Time: 04/07/2018 1:07  AM Performed by: Joaquin CourtsWendel, Nicholi Ghuman K, MD Authorized by: Blane OharaZavitz, Joshua, MD   Consent:    Consent obtained:  Emergent situation Pre-procedure details:    Skin preparation:  2% Chlorhexidine Anesthesia (see MAR for exact dosages):    Anesthesia method:  None Procedure details:    Location:  R femoral   Number of attempts:  1   Transducer: waveform confirmed   Post-procedure details:    Post-procedure:  Biopatch applied, sterile dressing applied and sutured   Patient tolerance of procedure:  Tolerated well, no immediate complications   (including critical care time)  Medications Ordered in ED Medications  norepinephrine (LEVOPHED) 4mg  in D5W 250mL premix infusion (20 mcg/min Intravenous New Bag/Given 2018-01-12 2239)  norepinephrine (LEVOPHED) 4-5 MG/250ML-% infusion SOLN (has no administration in time range)  calcium chloride 10 % injection (has no administration in time range)  sodium bicarbonate 1 mEq/mL injection (has no administration in time range)  EPINEPHrine (ADRENALIN) 1 MG/10ML injection (has no administration in time range)  potassium chloride 10 mEq in 100 mL IVPB (10 mEq Intravenous New Bag/Given 03/23/2018 0031)  sodium bicarbonate 150 mEq in dextrose 5 % 1,000 mL infusion ( Intravenous New Bag/Given 03/13/2018 0031)  phenylephrine 0.4-0.9 MG/10ML-% injection (has no administration in time range)  sodium chloride 0.9 % bolus 1,000 mL (0 mLs Intravenous Stopped 04/08/2018 0032)  EPINEPHrine (ADRENALIN) 4 mg in dextrose 5 % 250 mL (0.016 mg/mL) infusion (75 mcg/min Intravenous Rate/Dose Change 2018-01-12 2332)  potassium chloride 10 mEq in 100 mL IVPB (0 mEq Intravenous Stopped 03/13/2018 0005)  potassium  chloride 10 mEq in 100 mL IVPB (10 mEq Intravenous New Bag/Given 2018-01-12 2340)  atropine injection (1 mg Intravenous Given 2018-01-12 2338)  EPINEPHrine (ADRENALIN) 1 MG/10ML injection (1 Syringe Intravenous Given 2018-01-12 2315)  sodium bicarbonate injection (50 mEq Intravenous Given 2018-01-12 2231)  magnesium sulfate (IV Push/IM) injection (2 g Intravenous Given 2018-01-12 2324)  lidocaine (cardiac) 2000 mg in dextrose 5% 500 mL (4mg /mL) IV infusion (100 mg/min Intravenous New Bag/Given 2018-01-12 2321)  0.9 %  sodium chloride infusion (1,000 mLs Intravenous New Bag/Given 2018-01-12 2348)  EPINEPHrine (ADRENALIN) 4 mg in dextrose 5 % 250 mL (0.016 mg/mL) infusion (100 mcg/min Intravenous New Bag/Given 2018-01-12 2349)     Initial Impression / Assessment and Plan / ED Course  I have reviewed the triage vital signs and the nursing notes.  Pertinent labs & imaging results that were available during my care of the patient were reviewed by me and considered in my medical decision making (see chart for details).     Patient is a 43 y.o. female with PMHx of HTN presenting for witnessed arrest at home, EMS stated that patient was VT on the monitor - received amiodarone x2 and defibrillation, CPR in progress.  Upon arrival, patient had no pulse and was PEA on the monitor. CPR was continued, access was obtained, 2L LR were given, Calcium chloride and bicarb were given. Within CPR pulse check, patient had multiple episodes of VT and VF on the monitor. Patient was given IV lidocaine and defibrillated multiple times.  Patient was started on levophed gtt and epi gtt for hypotension. Patient was reintubated as there was concern that the ET tube was filled with blood. Successful replacement of the ET and end tidal was able to measured. For access, L femoral central line was placed and for hemodynamic monitoring R arterial line was placed.  Cardiology was consulted and came to see patient at the bedside. No indication for  emergent cardiac catherization as patient's pupils are fixed and dilated, she has no signs of life at this time and is not breathing over the ventilator.  Patient continued to have multiple episodes of loss of pulses, bicarb was pushed and patient was placed on bicarb drip. Labs show that patient has K 2.7, therefore 10 mEq of KCl were given x2.  Critical care came down to see patient at the bedside.  Family was briefed at bedside and questions were answered. Did not given TPA for concern of PE as cardio echo showed no RV dilation at this time.  0101: patient lost pulses despite max-ed out epi & levophed gtt, phenylephrine was pushed without result. Patient became bradycardiac and asystolic. Time of dealth was called. Family was in the room. 0122: Medical examiner was contacted, determined to be natural causes and will not be ME case at this time. ED provider will sign death certificate as patient does not have primary care provider.   Final Clinical Impressions(s) / ED Diagnoses   Final diagnoses:  Cardiac arrest [facility])    ED Discharge Orders    None       Joaquin Courts, MD 04-02-18 1610    Joaquin Courts, MD April 02, 2018 Margarito Courser    Blane Ohara, MD 03/30/18 1356

## 2018-04-13 DEATH — deceased

## 2019-08-05 IMAGING — CR DG CHEST 2V
2 series · 2 of 2 positions shown · non-contrast
Comparison: 08/26/2008, 07/23/2015

CLINICAL DATA: 42-year-old female with a history of hypertension
and shortness of breath

EXAM:
CHEST - 2 VIEW

[chest pa]
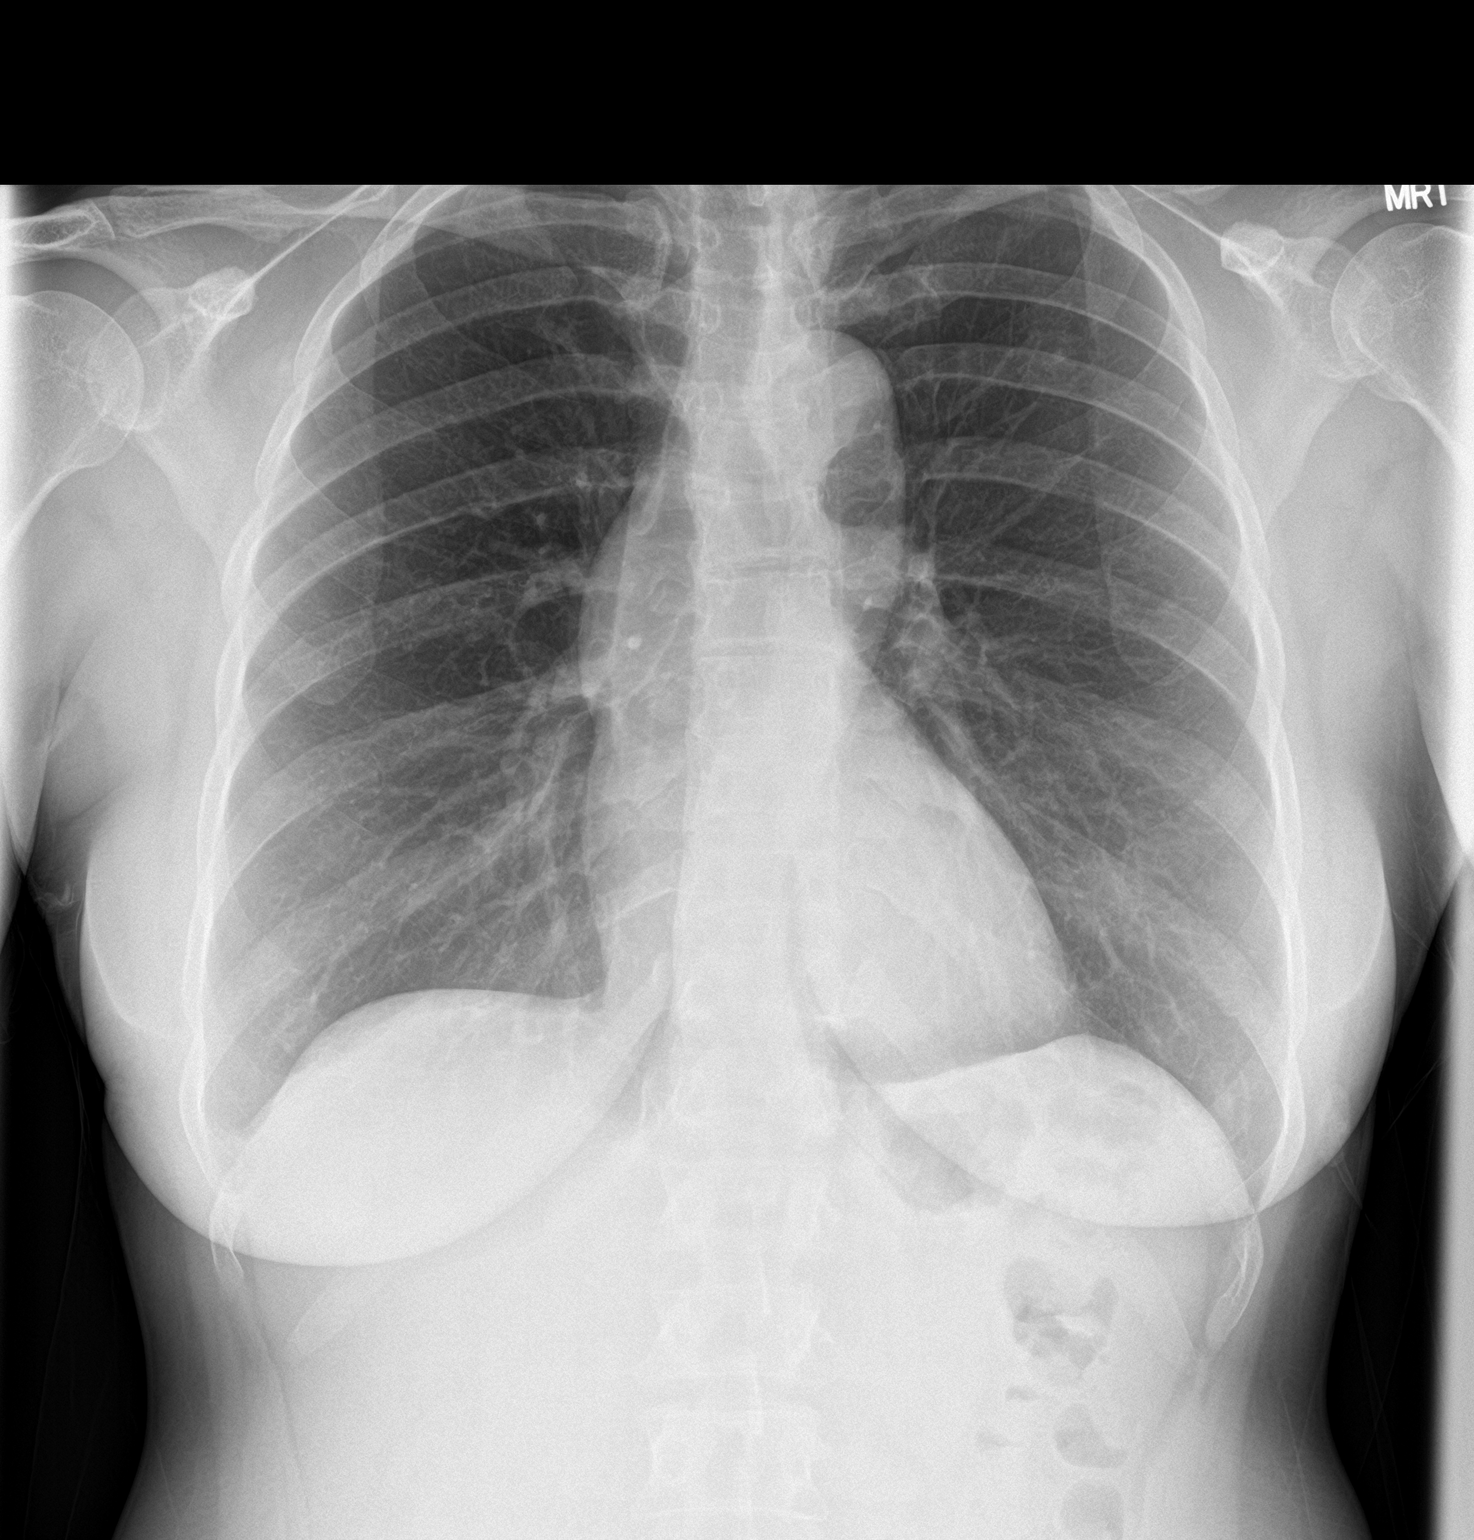

[chest lat]
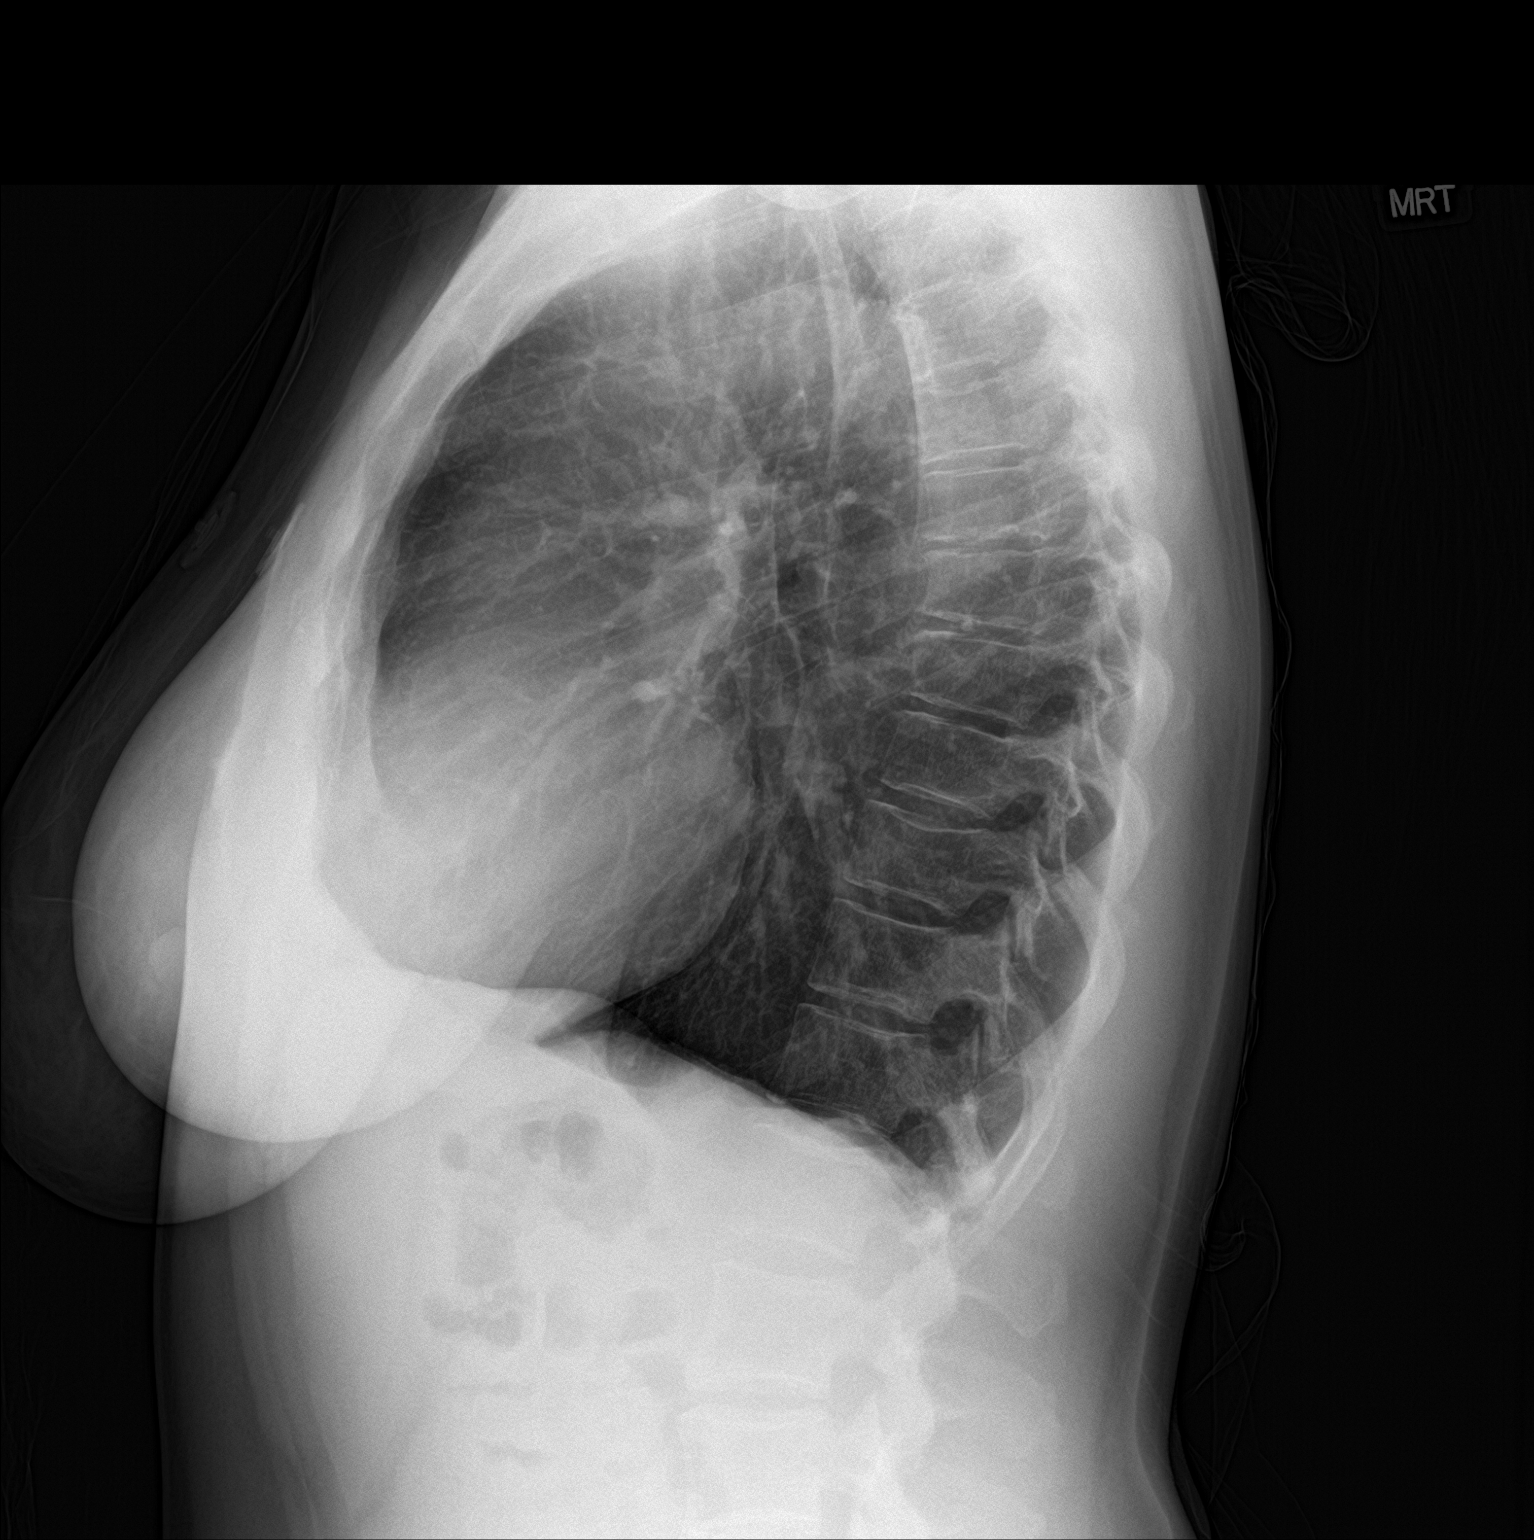

[2 of 2 positions shown; findings below may reference images not displayed]

FINDINGS: The heart size and mediastinal contours are within normal limits.
Both lungs are clear. The visualized skeletal structures are
unremarkable.
IMPRESSION: No active cardiopulmonary disease.
